# Patient Record
Sex: Female | Born: 1940 | Race: White | Hispanic: No | Marital: Married | Smoking: Former smoker
Health system: Southern US, Community
[De-identification: ages and names within clinical notes are randomized; demographics above are authoritative.]

## PROBLEM LIST (undated history)

## (undated) DIAGNOSIS — E7800 Pure hypercholesterolemia, unspecified: Secondary | ICD-10-CM

## (undated) DIAGNOSIS — Z87891 Personal history of nicotine dependence: Secondary | ICD-10-CM

## (undated) DIAGNOSIS — G5602 Carpal tunnel syndrome, left upper limb: Secondary | ICD-10-CM

## (undated) DIAGNOSIS — R112 Nausea with vomiting, unspecified: Secondary | ICD-10-CM

## (undated) DIAGNOSIS — E119 Type 2 diabetes mellitus without complications: Secondary | ICD-10-CM

## (undated) DIAGNOSIS — M549 Dorsalgia, unspecified: Secondary | ICD-10-CM

## (undated) DIAGNOSIS — K760 Fatty (change of) liver, not elsewhere classified: Secondary | ICD-10-CM

## (undated) DIAGNOSIS — D696 Thrombocytopenia, unspecified: Secondary | ICD-10-CM

## (undated) DIAGNOSIS — K219 Gastro-esophageal reflux disease without esophagitis: Secondary | ICD-10-CM

## (undated) DIAGNOSIS — F32A Depression, unspecified: Secondary | ICD-10-CM

## (undated) DIAGNOSIS — I10 Essential (primary) hypertension: Secondary | ICD-10-CM

## (undated) DIAGNOSIS — D509 Iron deficiency anemia, unspecified: Secondary | ICD-10-CM

## (undated) DIAGNOSIS — F329 Major depressive disorder, single episode, unspecified: Secondary | ICD-10-CM

## (undated) DIAGNOSIS — E559 Vitamin D deficiency, unspecified: Secondary | ICD-10-CM

## (undated) DIAGNOSIS — G8929 Other chronic pain: Secondary | ICD-10-CM

## (undated) DIAGNOSIS — Z9889 Other specified postprocedural states: Secondary | ICD-10-CM

## (undated) HISTORY — PX: COLONOSCOPY: SHX174

## (undated) HISTORY — PX: CATARACT EXTRACTION, BILATERAL: SHX1313

## (undated) HISTORY — PX: ESOPHAGOGASTRODUODENOSCOPY: SHX1529

## (undated) HISTORY — PX: APPENDECTOMY: SHX54

## (undated) HISTORY — PX: EYE SURGERY: SHX253

---

## 1994-06-20 HISTORY — PX: VAGINAL HYSTERECTOMY: SHX2639

## 2005-04-09 ENCOUNTER — Ambulatory Visit: Payer: Self-pay

## 2006-05-06 ENCOUNTER — Ambulatory Visit: Payer: Self-pay

## 2007-02-27 ENCOUNTER — Ambulatory Visit: Payer: Self-pay | Admitting: Unknown Physician Specialty

## 2007-05-12 ENCOUNTER — Ambulatory Visit: Payer: Self-pay

## 2008-05-13 ENCOUNTER — Ambulatory Visit: Payer: Self-pay

## 2009-11-16 ENCOUNTER — Ambulatory Visit: Payer: Self-pay | Admitting: Ophthalmology

## 2010-02-27 ENCOUNTER — Ambulatory Visit: Payer: Self-pay | Admitting: Unknown Physician Specialty

## 2010-03-28 ENCOUNTER — Ambulatory Visit: Payer: Self-pay

## 2010-04-19 ENCOUNTER — Ambulatory Visit: Payer: Self-pay

## 2010-04-24 ENCOUNTER — Ambulatory Visit: Payer: Self-pay

## 2010-05-25 ENCOUNTER — Ambulatory Visit: Payer: Self-pay

## 2010-06-29 ENCOUNTER — Ambulatory Visit: Payer: Self-pay | Admitting: Unknown Physician Specialty

## 2010-06-30 LAB — PATHOLOGY REPORT

## 2010-10-25 ENCOUNTER — Ambulatory Visit: Payer: Self-pay | Admitting: Ophthalmology

## 2011-05-01 ENCOUNTER — Ambulatory Visit: Payer: Self-pay

## 2012-08-12 ENCOUNTER — Ambulatory Visit: Payer: Self-pay | Admitting: Internal Medicine

## 2012-08-18 ENCOUNTER — Emergency Department: Payer: Self-pay | Admitting: Unknown Physician Specialty

## 2013-08-13 ENCOUNTER — Ambulatory Visit: Payer: Self-pay | Admitting: Internal Medicine

## 2014-04-14 DIAGNOSIS — E559 Vitamin D deficiency, unspecified: Secondary | ICD-10-CM | POA: Insufficient documentation

## 2014-04-14 DIAGNOSIS — G8929 Other chronic pain: Secondary | ICD-10-CM | POA: Insufficient documentation

## 2014-04-14 DIAGNOSIS — E7800 Pure hypercholesterolemia, unspecified: Secondary | ICD-10-CM | POA: Insufficient documentation

## 2014-04-14 DIAGNOSIS — K219 Gastro-esophageal reflux disease without esophagitis: Secondary | ICD-10-CM | POA: Insufficient documentation

## 2014-04-14 DIAGNOSIS — E119 Type 2 diabetes mellitus without complications: Secondary | ICD-10-CM | POA: Insufficient documentation

## 2014-04-14 DIAGNOSIS — I10 Essential (primary) hypertension: Secondary | ICD-10-CM | POA: Insufficient documentation

## 2014-07-02 ENCOUNTER — Ambulatory Visit: Payer: Self-pay | Admitting: Family Medicine

## 2014-08-16 ENCOUNTER — Ambulatory Visit: Payer: Self-pay | Admitting: Internal Medicine

## 2015-04-19 ENCOUNTER — Other Ambulatory Visit: Payer: Self-pay | Admitting: Internal Medicine

## 2015-04-19 DIAGNOSIS — Z1231 Encounter for screening mammogram for malignant neoplasm of breast: Secondary | ICD-10-CM

## 2015-07-22 ENCOUNTER — Encounter: Payer: Self-pay | Admitting: *Deleted

## 2015-07-25 ENCOUNTER — Ambulatory Visit: Payer: Medicare Other | Admitting: *Deleted

## 2015-07-25 ENCOUNTER — Encounter: Payer: Self-pay | Admitting: Anesthesiology

## 2015-07-25 ENCOUNTER — Ambulatory Visit
Admission: RE | Admit: 2015-07-25 | Discharge: 2015-07-25 | Disposition: A | Discharge status: Home / Self Care | Payer: Medicare Other | Source: Ambulatory Visit | Attending: Unknown Physician Specialty | Admitting: Unknown Physician Specialty

## 2015-07-25 ENCOUNTER — Encounter
Admission: RE | Disposition: A | Discharge status: Home / Self Care | Payer: Self-pay | Source: Ambulatory Visit | Attending: Unknown Physician Specialty

## 2015-07-25 DIAGNOSIS — K3189 Other diseases of stomach and duodenum: Secondary | ICD-10-CM | POA: Diagnosis not present

## 2015-07-25 DIAGNOSIS — D509 Iron deficiency anemia, unspecified: Secondary | ICD-10-CM | POA: Insufficient documentation

## 2015-07-25 DIAGNOSIS — I10 Essential (primary) hypertension: Secondary | ICD-10-CM | POA: Diagnosis not present

## 2015-07-25 DIAGNOSIS — Z888 Allergy status to other drugs, medicaments and biological substances status: Secondary | ICD-10-CM | POA: Diagnosis not present

## 2015-07-25 DIAGNOSIS — Z7984 Long term (current) use of oral hypoglycemic drugs: Secondary | ICD-10-CM | POA: Insufficient documentation

## 2015-07-25 DIAGNOSIS — K210 Gastro-esophageal reflux disease with esophagitis: Secondary | ICD-10-CM | POA: Insufficient documentation

## 2015-07-25 DIAGNOSIS — E119 Type 2 diabetes mellitus without complications: Secondary | ICD-10-CM | POA: Insufficient documentation

## 2015-07-25 DIAGNOSIS — Z885 Allergy status to narcotic agent status: Secondary | ICD-10-CM | POA: Insufficient documentation

## 2015-07-25 DIAGNOSIS — Z86010 Personal history of colonic polyps: Secondary | ICD-10-CM | POA: Diagnosis not present

## 2015-07-25 DIAGNOSIS — E669 Obesity, unspecified: Secondary | ICD-10-CM | POA: Diagnosis not present

## 2015-07-25 DIAGNOSIS — E7800 Pure hypercholesterolemia, unspecified: Secondary | ICD-10-CM | POA: Diagnosis not present

## 2015-07-25 DIAGNOSIS — Z6828 Body mass index (BMI) 28.0-28.9, adult: Secondary | ICD-10-CM | POA: Insufficient documentation

## 2015-07-25 DIAGNOSIS — F329 Major depressive disorder, single episode, unspecified: Secondary | ICD-10-CM | POA: Diagnosis not present

## 2015-07-25 DIAGNOSIS — K640 First degree hemorrhoids: Secondary | ICD-10-CM | POA: Insufficient documentation

## 2015-07-25 DIAGNOSIS — K2950 Unspecified chronic gastritis without bleeding: Secondary | ICD-10-CM | POA: Diagnosis not present

## 2015-07-25 HISTORY — DX: Major depressive disorder, single episode, unspecified: F32.9

## 2015-07-25 HISTORY — DX: Dorsalgia, unspecified: M54.9

## 2015-07-25 HISTORY — DX: Essential (primary) hypertension: I10

## 2015-07-25 HISTORY — DX: Depression, unspecified: F32.A

## 2015-07-25 HISTORY — DX: Type 2 diabetes mellitus without complications: E11.9

## 2015-07-25 HISTORY — DX: Pure hypercholesterolemia, unspecified: E78.00

## 2015-07-25 HISTORY — PX: COLONOSCOPY WITH PROPOFOL: SHX5780

## 2015-07-25 HISTORY — DX: Gastro-esophageal reflux disease without esophagitis: K21.9

## 2015-07-25 HISTORY — PX: ESOPHAGOGASTRODUODENOSCOPY (EGD) WITH PROPOFOL: SHX5813

## 2015-07-25 LAB — GLUCOSE, CAPILLARY: Glucose-Capillary: 143 mg/dL — ABNORMAL HIGH (ref 65–99)

## 2015-07-25 SURGERY — COLONOSCOPY WITH PROPOFOL
Anesthesia: General

## 2015-07-25 MED ORDER — FENTANYL CITRATE (PF) 100 MCG/2ML IJ SOLN
INTRAMUSCULAR | Status: DC | PRN
  Administered 2015-07-25: 50 ug via INTRAVENOUS

## 2015-07-25 MED ORDER — METOCLOPRAMIDE HCL 5 MG/ML IJ SOLN
INTRAMUSCULAR | Status: DC | PRN
  Administered 2015-07-25: 10 mg via INTRAVENOUS

## 2015-07-25 MED ORDER — ONDANSETRON HCL 4 MG/2ML IJ SOLN
INTRAMUSCULAR | Status: DC | PRN
  Administered 2015-07-25: 4 mg via INTRAVENOUS

## 2015-07-25 MED ORDER — SODIUM CHLORIDE 0.9 % IV SOLN: INTRAVENOUS | Status: DC

## 2015-07-25 MED ORDER — MIDAZOLAM HCL 2 MG/2ML IJ SOLN
INTRAMUSCULAR | Status: DC | PRN
  Administered 2015-07-25: 1 mg via INTRAVENOUS

## 2015-07-25 MED ORDER — PROPOFOL 500 MG/50ML IV EMUL
INTRAVENOUS | Status: DC | PRN
  Administered 2015-07-25: 120 ug/kg/min via INTRAVENOUS

## 2015-07-25 MED ORDER — SODIUM CHLORIDE 0.9 % IV SOLN
INTRAVENOUS | Status: DC
  Administered 2015-07-25: 1000 mL via INTRAVENOUS

## 2015-07-25 MED ORDER — LIDOCAINE HCL (CARDIAC) 20 MG/ML IV SOLN
INTRAVENOUS | Status: DC | PRN
  Administered 2015-07-25: 50 mg via INTRAVENOUS

## 2015-07-25 NOTE — H&P (Signed)
   Primary Care Physician:  Rafael BihariWALKER III, JOHN B, MD Primary Gastroenterologist:  Dr. Mechele CollinElliott  Pre-Procedure History & Physical: HPI:  Monica OlpLinda V Hess is a 74 y.o. female is here for an endoscopy and colonoscopy.   Past Medical History  Diagnosis Date  . Depression   . Hypercholesterolemia   . Back pain   . GERD (gastroesophageal reflux disease)   . Hypertension   . Diabetes mellitus without complication [facility](HCC)     Past Surgical History  Procedure Laterality Date  . Appendectomy    . Eye surgery    . Colonoscopy    . Esophagogastroduodenoscopy      Prior to Admission medications   Medication Sig Start Date End Date Taking? Authorizing Provider  amLODipine-olmesartan (AZOR) 5-40 MG tablet Take 1 tablet by mouth daily.   Yes Historical Provider, MD  cholestyramine Lanetta Inch(QUESTRAN) 4 G packet Take 4 g by mouth 3 (three) times daily with meals.   Yes Historical Provider, MD  estrogens, conjugated, (PREMARIN) 0.3 MG tablet Take 0.3 mg by mouth daily. Take daily for 21 days then do not take for 7 days.   Yes Historical Provider, MD  fluticasone (FLONASE) 50 MCG/ACT nasal spray Place into both nostrils daily.   Yes Historical Provider, MD  glipiZIDE (GLUCOTROL) 5 MG tablet Take by mouth daily before breakfast.   Yes Historical Provider, MD  metFORMIN (GLUCOPHAGE) 500 MG tablet Take by mouth 2 (two) times daily with a meal.   Yes Historical Provider, MD  omeprazole (PRILOSEC) 20 MG capsule Take 20 mg by mouth daily.   Yes Historical Provider, MD    Allergies as of 06/21/2015  . (Not on File)    No family history on file.  Social History   Social History  . Marital Status: Married    Spouse Name: N/A  . Number of Children: N/A  . Years of Education: N/A   Occupational History  . Not on file.   Social History Main Topics  . Smoking status: Not on file  . Smokeless tobacco: Not on file  . Alcohol Use: Not on file  . Drug Use: Not on file  . Sexual Activity: Not on file   Other  Topics Concern  . Not on file   Social History Narrative  . No narrative on file    Review of Systems: See HPI, otherwise negative ROS  Physical Exam: BP 186/58 mmHg  Pulse 68  Temp(Src) 97.8 F (36.6 C) (Tympanic)  Resp 16  Ht 5\' 5"  (1.651 m)  Wt 78.019 kg (172 lb)  BMI 28.62 kg/m2  SpO2 99% General:   Alert,  pleasant and cooperative in NAD Head:  Normocephalic and atraumatic. Neck:  Supple; no masses or thyromegaly. Lungs:  Clear throughout to auscultation.    Heart:  Regular rate and rhythm. Abdomen:  Soft, nontender and nondistended. Normal bowel sounds, without guarding, and without rebound.   Neurologic:  Alert and  oriented x4;  grossly normal neurologically.  Impression/Plan: Monica Hess is here for an endoscopy and colonoscopy to be performed for iron def anemia  Risks, benefits, limitations, and alternatives regarding  endoscopy and colonoscopy have been reviewed with the patient.  Questions have been answered.  All parties agreeable.   [name], Terryl Molinelli, MD  07/25/2015, 9:01 AM

## 2015-07-25 NOTE — Op Note (Signed)
[facility] Gastroenterology Patient Name: Monica Hess Procedure Date: 07/25/2015 8:54 AM MRN: [id number] Account #: [id number] Date of Birth: 1941/01/23 Admit Type: Outpatient Age: 74 Room: [facility] ENDO ROOM 1 Gender: Female Note Status: Finalized Procedure:         Colonoscopy Indications:       Iron deficiency anemia, Unexplained iron deficiency                     anemia, Personal history of colon polyps. Providers:         Scot Jun, MD Referring MD:      Letta Pate. Danne Harbor, MD (Referring MD) Medicines:         Propofol per Anesthesia Complications:     No immediate complications. Procedure:         Pre-Anesthesia Assessment:                    - After reviewing the risks and benefits, the patient was                     deemed in satisfactory condition to undergo the procedure.                    After obtaining informed consent, the colonoscope was                     passed under direct vision. Throughout the procedure, the                     patient's blood pressure, pulse, and oxygen saturations                     were monitored continuously. The Colonoscope was                     introduced through the anus and advanced to the the cecum,                     identified by appendiceal orifice and ileocecal valve. The                     colonoscopy was performed without difficulty. The patient                     tolerated the procedure well. The quality of the bowel                     preparation was excellent. Findings:      Internal hemorrhoids were found during endoscopy. The hemorrhoids were       small and Grade I (internal hemorrhoids that do not prolapse).      The exam was otherwise without abnormality. Prep excellent. Impression:        - Internal hemorrhoids.                    - The examination was otherwise normal.                    - No specimens collected. Recommendation:    - Repeat colonoscopy in 5 years for  surveillance. Scot Jun, MD 07/25/2015 9:45:31 AM This report has been signed electronically. Number of Addenda: 0 Note Initiated On: 07/25/2015 8:54 AM Scope Withdrawal Time: 0 hours 9 minutes 24 seconds  Total Procedure Duration: 0 hours  19 minutes 17 seconds       [facility]

## 2015-07-25 NOTE — Transfer of Care (Signed)
Immediate Anesthesia Transfer of Care Note  Patient: Monica OlpLinda V Hess  Procedure(s) Performed: Procedure(s): COLONOSCOPY WITH PROPOFOL (N/A) ESOPHAGOGASTRODUODENOSCOPY (EGD) WITH PROPOFOL (N/A)  Patient Location: PACU  Anesthesia Type:General  Level of Consciousness: awake, alert  and sedated  Airway & Oxygen Therapy: Patient Spontanous Breathing and Patient connected to nasal cannula oxygen  Post-op Assessment: Report given to RN and Post -op Vital signs reviewed and stable  Post vital signs: Reviewed and stable  Last Vitals:  Filed Vitals:   07/25/15 0744  BP: 186/58  Pulse: 68  Temp: 36.6 C  Resp: 16    Complications: No apparent anesthesia complications

## 2015-07-25 NOTE — Anesthesia Procedure Notes (Signed)
Performed by: Tonia GhentOOK-MARTIN, Marvelyn Bouchillon Pre-anesthesia Checklist: Patient identified, Emergency Drugs available, Suction available and Patient being monitored Patient Re-evaluated:Patient Re-evaluated prior to inductionOxygen Delivery Method: Nasal cannula Preoxygenation: Pre-oxygenation with 100% oxygen Intubation Type: IV induction Airway Equipment and Method: Bite block

## 2015-07-25 NOTE — Anesthesia Postprocedure Evaluation (Signed)
  Anesthesia Post-op Note  Patient: Monica Hess  Procedure(s) Performed: Procedure(s): COLONOSCOPY WITH PROPOFOL (N/A) ESOPHAGOGASTRODUODENOSCOPY (EGD) WITH PROPOFOL (N/A)  Anesthesia type:General  Patient location: PACU  Post pain: Pain level controlled  Post assessment: Post-op Vital signs reviewed, Patient's Cardiovascular Status Stable, Respiratory Function Stable, Patent Airway and No signs of Nausea or vomiting  Post vital signs: Reviewed and stable  Last Vitals:  Filed Vitals:   07/25/15 0949  BP: 150/56  Pulse: 66  Temp: 35.8 C  Resp: 17    Level of consciousness: awake, alert  and patient cooperative  Complications: No apparent anesthesia complications

## 2015-07-25 NOTE — Op Note (Signed)
[facility] Gastroenter[facility] Patient Name: Monica KocherLinda Hess Procedure Date: 07/25/2015 9:04 AM MRN: [id number] Account #: [id number] Date of Birth: [birth year] Admit Type: Outpatient Age: [age] Room[facility] ENDO ROOM 1 Gender: Female Note Status: Finalized Procedure:         Upper GI endoscopy Indications:       Unexplained iron deficiency anemia Providers:         Scot Junobert T. Husain Costabile, MD Referring MD:      Letta PateJohn B. Danne HarborWalker III, MD (Referring MD) Medicines:         Propofol per Anesthesia Complications:     No immediate complications. Procedure:         Pre-Anesthesia Assessment:                    - After reviewing the risks and benefits, the patient was                     deemed in satisfactory condition to undergo the procedure.                    After obtaining informed consent, the endoscope was passed                     under direct vision. Throughout the procedure, the                     patient's blood pressure, pulse, and oxygen saturations                     were monitored continuously. The Endoscope was introduced                     through the mouth, and advanced to the second part of                     duodenum. The upper GI endoscopy was accomplished without                     difficulty. The patient tolerated the procedure well. Findings:      There were esophageal mucosal changes suspicious for short-segment       Barrett's esophagus present at the gastroesophageal junction. The       maximum longitudinal extent of these mucosal changes was 1-2 cm in       length. Biopsies were taken with a cold forceps for histology. GEJ       40-41cm.      The entire examined stomach was normal. Biopsies were taken with a cold       forceps for histology. Biopsies were taken with a cold forceps for       Helicobacter pylori testing.      The duodenal bulb, first part of the duodenum and 2nd part of the       duodenum were normal. Biopsies for histology were taken with  a cold       forceps for for evaluation of possible celiac disease. 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 Biopsied.                    - Normal stomach. Biopsied.                    -  Normal duodenal bulb, first part of the duodenum and 2nd                     part of the duodenum. Biopsied. Recommendation:    - Await pathology results. Scot Jun, MD 07/25/2015 9:21:42 AM This report has been signed electronically. Number of Addenda: 0 Note Initiated On: 07/25/2015 9:04 AM      [facility]

## 2015-07-25 NOTE — Anesthesia Preprocedure Evaluation (Signed)
Anesthesia Evaluation  Patient identified by MRN, date of birth, ID band Patient awake    Reviewed: Allergy & Precautions, NPO status , Patient's Chart, lab work & pertinent test results  History of Anesthesia Complications (+) PONV  Airway Mallampati: II  TM Distance: >3 FB Neck ROM: Limited    Dental  (+) Teeth Intact   Pulmonary    Pulmonary exam normal        Cardiovascular Exercise Tolerance: Good hypertension, Pt. on medications and Pt. on home beta blockers Normal cardiovascular exam     Neuro/Psych    GI/Hepatic GERD  Medicated and Controlled,  Endo/Other  diabetes, Type 2BG 143.  Renal/GU      Musculoskeletal   Abdominal (+) + obese,  Abdomen: soft.    Peds  Hematology   Anesthesia Other Findings   Reproductive/Obstetrics                             Anesthesia Physical Anesthesia Plan  ASA: III  Anesthesia Plan: General   Post-op Pain Management:    Induction: Intravenous  Airway Management Planned: Nasal Cannula  Additional Equipment:   Intra-op Plan:   Post-operative Plan:   Informed Consent: I have reviewed the patients History and Physical, chart, labs and discussed the procedure including the risks, benefits and alternatives for the proposed anesthesia with the patient or authorized representative who has indicated his/her understanding and acceptance.     Plan Discussed with: CRNA  Anesthesia Plan Comments:         Anesthesia Quick Evaluation

## 2015-07-26 ENCOUNTER — Encounter: Payer: Self-pay | Admitting: Unknown Physician Specialty

## 2015-07-28 LAB — SURGICAL PATHOLOGY

## 2015-08-22 ENCOUNTER — Ambulatory Visit
Admission: RE | Admit: 2015-08-22 | Discharge: 2015-08-22 | Disposition: A | Discharge status: Home / Self Care | Payer: Medicare Other | Source: Ambulatory Visit | Attending: Internal Medicine | Admitting: Internal Medicine

## 2015-08-22 ENCOUNTER — Other Ambulatory Visit: Payer: Self-pay | Admitting: Internal Medicine

## 2015-08-22 DIAGNOSIS — Z1231 Encounter for screening mammogram for malignant neoplasm of breast: Secondary | ICD-10-CM | POA: Diagnosis not present

## 2015-08-23 DIAGNOSIS — D696 Thrombocytopenia, unspecified: Secondary | ICD-10-CM | POA: Insufficient documentation

## 2015-11-17 ENCOUNTER — Ambulatory Visit: Payer: Self-pay | Admitting: Urology

## 2016-05-24 ENCOUNTER — Other Ambulatory Visit: Payer: Self-pay | Admitting: Internal Medicine

## 2016-05-24 DIAGNOSIS — Z1231 Encounter for screening mammogram for malignant neoplasm of breast: Secondary | ICD-10-CM

## 2016-08-22 ENCOUNTER — Ambulatory Visit
Admission: RE | Admit: 2016-08-22 | Discharge: 2016-08-22 | Disposition: A | Discharge status: Home / Self Care | Payer: Medicare Other | Source: Ambulatory Visit | Attending: Internal Medicine | Admitting: Internal Medicine

## 2016-08-22 DIAGNOSIS — Z1231 Encounter for screening mammogram for malignant neoplasm of breast: Secondary | ICD-10-CM | POA: Insufficient documentation

## 2017-05-29 ENCOUNTER — Other Ambulatory Visit: Payer: Self-pay | Admitting: Internal Medicine

## 2017-05-29 DIAGNOSIS — Z1231 Encounter for screening mammogram for malignant neoplasm of breast: Secondary | ICD-10-CM

## 2017-09-03 ENCOUNTER — Ambulatory Visit
Admission: RE | Admit: 2017-09-03 | Discharge: 2017-09-03 | Disposition: A | Discharge status: Home / Self Care | Payer: Medicare Other | Source: Ambulatory Visit | Attending: Internal Medicine | Admitting: Internal Medicine

## 2017-09-03 DIAGNOSIS — Z1231 Encounter for screening mammogram for malignant neoplasm of breast: Secondary | ICD-10-CM

## 2017-09-28 ENCOUNTER — Encounter: Payer: Self-pay | Admitting: Emergency Medicine

## 2017-09-28 ENCOUNTER — Emergency Department: Payer: Medicare Other

## 2017-09-28 ENCOUNTER — Emergency Department
Admission: EM | Admit: 2017-09-28 | Discharge: 2017-09-28 | Disposition: A | Discharge status: Home / Self Care | Payer: Medicare Other | Attending: Emergency Medicine | Admitting: Emergency Medicine

## 2017-09-28 DIAGNOSIS — Y999 Unspecified external cause status: Secondary | ICD-10-CM | POA: Insufficient documentation

## 2017-09-28 DIAGNOSIS — Y92524 Gas station as the place of occurrence of the external cause: Secondary | ICD-10-CM | POA: Insufficient documentation

## 2017-09-28 DIAGNOSIS — Z79899 Other long term (current) drug therapy: Secondary | ICD-10-CM | POA: Insufficient documentation

## 2017-09-28 DIAGNOSIS — E119 Type 2 diabetes mellitus without complications: Secondary | ICD-10-CM | POA: Diagnosis not present

## 2017-09-28 DIAGNOSIS — Z7984 Long term (current) use of oral hypoglycemic drugs: Secondary | ICD-10-CM | POA: Insufficient documentation

## 2017-09-28 DIAGNOSIS — I10 Essential (primary) hypertension: Secondary | ICD-10-CM | POA: Diagnosis not present

## 2017-09-28 DIAGNOSIS — W010XXA Fall on same level from slipping, tripping and stumbling without subsequent striking against object, initial encounter: Secondary | ICD-10-CM | POA: Diagnosis not present

## 2017-09-28 DIAGNOSIS — S0990XA Unspecified injury of head, initial encounter: Secondary | ICD-10-CM | POA: Diagnosis present

## 2017-09-28 DIAGNOSIS — W19XXXA Unspecified fall, initial encounter: Secondary | ICD-10-CM

## 2017-09-28 DIAGNOSIS — S01511A Laceration without foreign body of lip, initial encounter: Secondary | ICD-10-CM | POA: Insufficient documentation

## 2017-09-28 DIAGNOSIS — Y9301 Activity, walking, marching and hiking: Secondary | ICD-10-CM | POA: Insufficient documentation

## 2017-09-28 DIAGNOSIS — S0181XA Laceration without foreign body of other part of head, initial encounter: Secondary | ICD-10-CM

## 2017-09-28 MED ORDER — LIDOCAINE-EPINEPHRINE-TETRACAINE (LET) SOLUTION: 3.0000 mL | Freq: Once | NASAL | Status: DC

## 2017-09-28 MED ORDER — LIDOCAINE-EPINEPHRINE-TETRACAINE (LET) SOLUTION
NASAL | Status: AC
Start: 2017-09-28 — End: 2017-09-28
  Administered 2017-09-28: 3 mL
  Filled 2017-09-28: qty 3

## 2017-09-28 MED ORDER — CEPHALEXIN 500 MG PO CAPS: 500.0000 mg | ORAL_CAPSULE | Freq: Three times a day (TID) | ORAL | 0 refills | Status: AC

## 2017-09-28 MED ORDER — LIDOCAINE HCL (PF) 1 % IJ SOLN
INTRAMUSCULAR | Status: AC
  Filled 2017-09-28: qty 5

## 2017-09-28 NOTE — ED Provider Notes (Signed)
[facility] Emergency Department Provider Note  ____________________________________________  Time seen: Approximately 6:14 PM  I have reviewed the triage vital signs and the nursing notes.   HISTORY  Chief Complaint Fall    HPI Monica Hess is a 77 y.o. female presents to the emergency department patient with a 1 cm upper lip anterior laceration after patient tripped and fell at a gas station hours ago.  Patient hit her head but did not lose consciousness.  She denies new blurry vision, nausea, vomiting or disorientation.  She has been ambulating without difficulty.  No neck pain or radiculopathy in the upper or lower extremities.  Tetanus status is up-to-date.  Past Medical History:  Diagnosis Date  . Back pain   . Depression   . Diabetes mellitus without complication (HCC)   . GERD (gastroesophageal reflux disease)   . Hypercholesterolemia   . Hypertension     There are no active problems to display for this patient.   Past Surgical History:  Procedure Laterality Date  . APPENDECTOMY    . COLONOSCOPY    . COLONOSCOPY WITH PROPOFOL N/A 07/25/2015   Procedure: COLONOSCOPY WITH PROPOFOL;  Surgeon: Scot Jun, MD;  Location: [facility] ENDOSCOPY;  Service: Endoscopy;  Laterality: N/A;  . ESOPHAGOGASTRODUODENOSCOPY    . ESOPHAGOGASTRODUODENOSCOPY (EGD) WITH PROPOFOL N/A 07/25/2015   Procedure: ESOPHAGOGASTRODUODENOSCOPY (EGD) WITH PROPOFOL;  Surgeon: Scot Jun, MD;  Location: [facility] ENDOSCOPY;  Service: Endoscopy;  Laterality: N/A;  . EYE SURGERY      Prior to Admission medications   Medication Sig Start Date End Date Taking? Authorizing Provider  amLODipine-olmesartan (AZOR) 5-40 MG tablet Take 1 tablet by mouth daily.    [provider]  cephALEXin (KEFLEX) 500 MG capsule Take 1 capsule (500 mg total) by mouth 3 (three) times daily for 10 days. 09/28/17 10/08/17  Orvil Feil, PA-C  cholestyramine Lanetta Inch) 4 G packet Take 4 g by  mouth 3 (three) times daily with meals.    [provider]  estrogens, conjugated, (PREMARIN) 0.3 MG tablet Take 0.3 mg by mouth daily. Take daily for 21 days then do not take for 7 days.    [provider]  fluticasone (FLONASE) 50 MCG/ACT nasal spray Place into both nostrils daily.    [provider]  glipiZIDE (GLUCOTROL) 5 MG tablet Take by mouth daily before breakfast.    [provider]  metFORMIN (GLUCOPHAGE) 500 MG tablet Take by mouth 2 (two) times daily with a meal.    [provider]  omeprazole (PRILOSEC) 20 MG capsule Take 20 mg by mouth daily.    [provider]    Allergies Codeine sulfate; Lipitor [atorvastatin]; Nitrofurantoin; and Vytorin [ezetimibe-simvastatin]  No family history on file.  Social History Social History   Tobacco Use  . Smoking status: Not on file  Substance Use Topics  . Alcohol use: Not on file  . Drug use: Not on file     Review of Systems  Constitutional: No fever/chills Eyes: No visual changes. No discharge ENT: No upper respiratory complaints. Cardiovascular: no chest pain. Respiratory: no cough. No SOB. Gastrointestinal: No abdominal pain.  No nausea, no vomiting.  No diarrhea.  No constipation. Musculoskeletal: Negative for musculoskeletal pain. Skin: Patient has upper interior lip laceration.  Neurological: Negative for headaches, focal weakness or numbness.   ____________________________________________   PHYSICAL EXAM:  VITAL SIGNS: ED Triage Vitals  Enc Vitals Group     BP 09/28/17 1428 111/73  Pulse Rate 09/28/17 1428 66     Resp 09/28/17 1428 16     Temp 09/28/17 1428 97.7 F (36.5 C)     Temp Source 09/28/17 1428 Oral     SpO2 09/28/17 1428 97 %     Weight 09/28/17 1428 174 lb (78.9 kg)     Height 09/28/17 1428 5\' 6"  (1.676 m)     Head Circumference --      Peak Flow --      Pain Score 09/28/17 1436 5     Pain Loc --      Pain Edu? --      Excl. in GC?  --      Constitutional: Alert and oriented. Well appearing and in no acute distress. Eyes: Conjunctivae are normal. PERRL. EOMI. Head: Atraumatic. ENT:      Ears: TMs are pearly bilaterally without evidence of bloody effusion.      Nose: No congestion/rhinnorhea.      Mouth/Throat: Mucous membranes are moist.  Neck: No stridor.  No cervical spine tenderness to palpation. Cardiovascular: Normal rate, regular rhythm. Normal S1 and S2.  Good peripheral circulation. Respiratory: Normal respiratory effort without tachypnea or retractions. Lungs CTAB. Good air entry to the bases with no decreased or absent breath sounds. Gastrointestinal: Bowel sounds 4 quadrants. Soft and nontender to palpation. No guarding or rigidity. No palpable masses. No distention. No CVA tenderness. Musculoskeletal: Full range of motion to all extremities. No gross deformities appreciated. Neurologic:  Normal speech and language. No gross focal neurologic deficits are appreciated.  Skin:  Patient has 1cm upper lip interior laceration. Facial bruising and abrasions visualized.  Psychiatric: Mood and affect are normal. Speech and behavior are normal. Patient exhibits appropriate insight and judgement.   ____________________________________________   LABS (all labs ordered are listed, but only abnormal results are displayed)  Labs Reviewed - No data to display ____________________________________________  EKG   ____________________________________________  RADIOLOGY Geraldo Pitter, personally viewed and evaluated these images (plain radiographs) as part of my medical decision making, as well as reviewing the written report by the radiologist.  Ct Head Wo Contrast  Result Date: 09/28/2017 CLINICAL DATA:  Fall, facial pain EXAM: CT HEAD WITHOUT CONTRAST CT MAXILLOFACIAL WITHOUT CONTRAST CT CERVICAL SPINE WITHOUT CONTRAST TECHNIQUE: Multidetector CT imaging of the head, cervical spine, and maxillofacial  structures were performed using the standard protocol without intravenous contrast. Multiplanar CT image reconstructions of the cervical spine and maxillofacial structures were also generated. COMPARISON:  None. FINDINGS: CT HEAD FINDINGS Brain: No evidence of acute infarction, hemorrhage, hydrocephalus, extra-axial collection or mass lesion/mass effect. Subcortical white matter and periventricular small vessel ischemic changes. Vascular: Mild intracranial atherosclerosis. Skull: Normal. Negative for fracture or focal lesion. Other: None. CT MAXILLOFACIAL FINDINGS Osseous: No evidence of maxillofacial fracture. The mandible is intact. The bilateral mandibular condyles are well-seated in the TMJs. Orbits: The bilateral orbits, including the globes and retroconal soft tissues, are within normal limits. Sinuses: The visualized paranasal sinuses are essentially clear. The mastoid air cells are unopacified. Soft tissues: Negative. CT CERVICAL SPINE FINDINGS Alignment: Normal cervical lordosis. Skull base and vertebrae: No acute fracture. No primary bone lesion or focal pathologic process. Soft tissues and spinal canal: No prevertebral fluid or swelling. No visible canal hematoma. Disc levels: Vertebra body heights and intervertebral disc spaces are maintained. Spinal canal is patent. Upper chest: Visualized lung apices are clear. Other: Visualized thyroid is unremarkable. IMPRESSION: No evidence of acute intracranial abnormality. Small vessel ischemic changes. No  evidence of maxillofacial fracture. Negative cervical spine CT. Electronically Signed   By: Charline Bills M.D.   On: 09/28/2017 16:07   Ct Cervical Spine Wo Contrast  Result Date: 09/28/2017 CLINICAL DATA:  Fall, facial pain EXAM: CT HEAD WITHOUT CONTRAST CT MAXILLOFACIAL WITHOUT CONTRAST CT CERVICAL SPINE WITHOUT CONTRAST TECHNIQUE: Multidetector CT imaging of the head, cervical spine, and maxillofacial structures were performed using the standard  protocol without intravenous contrast. Multiplanar CT image reconstructions of the cervical spine and maxillofacial structures were also generated. COMPARISON:  None. FINDINGS: CT HEAD FINDINGS Brain: No evidence of acute infarction, hemorrhage, hydrocephalus, extra-axial collection or mass lesion/mass effect. Subcortical white matter and periventricular small vessel ischemic changes. Vascular: Mild intracranial atherosclerosis. Skull: Normal. Negative for fracture or focal lesion. Other: None. CT MAXILLOFACIAL FINDINGS Osseous: No evidence of maxillofacial fracture. The mandible is intact. The bilateral mandibular condyles are well-seated in the TMJs. Orbits: The bilateral orbits, including the globes and retroconal soft tissues, are within normal limits. Sinuses: The visualized paranasal sinuses are essentially clear. The mastoid air cells are unopacified. Soft tissues: Negative. CT CERVICAL SPINE FINDINGS Alignment: Normal cervical lordosis. Skull base and vertebrae: No acute fracture. No primary bone lesion or focal pathologic process. Soft tissues and spinal canal: No prevertebral fluid or swelling. No visible canal hematoma. Disc levels: Vertebra body heights and intervertebral disc spaces are maintained. Spinal canal is patent. Upper chest: Visualized lung apices are clear. Other: Visualized thyroid is unremarkable. IMPRESSION: No evidence of acute intracranial abnormality. Small vessel ischemic changes. No evidence of maxillofacial fracture. Negative cervical spine CT. Electronically Signed   By: Charline Bills M.D.   On: 09/28/2017 16:07   Ct Maxillofacial Wo Contrast  Result Date: 09/28/2017 CLINICAL DATA:  Fall, facial pain EXAM: CT HEAD WITHOUT CONTRAST CT MAXILLOFACIAL WITHOUT CONTRAST CT CERVICAL SPINE WITHOUT CONTRAST TECHNIQUE: Multidetector CT imaging of the head, cervical spine, and maxillofacial structures were performed using the standard protocol without intravenous contrast. Multiplanar  CT image reconstructions of the cervical spine and maxillofacial structures were also generated. COMPARISON:  None. FINDINGS: CT HEAD FINDINGS Brain: No evidence of acute infarction, hemorrhage, hydrocephalus, extra-axial collection or mass lesion/mass effect. Subcortical white matter and periventricular small vessel ischemic changes. Vascular: Mild intracranial atherosclerosis. Skull: Normal. Negative for fracture or focal lesion. Other: None. CT MAXILLOFACIAL FINDINGS Osseous: No evidence of maxillofacial fracture. The mandible is intact. The bilateral mandibular condyles are well-seated in the TMJs. Orbits: The bilateral orbits, including the globes and retroconal soft tissues, are within normal limits. Sinuses: The visualized paranasal sinuses are essentially clear. The mastoid air cells are unopacified. Soft tissues: Negative. CT CERVICAL SPINE FINDINGS Alignment: Normal cervical lordosis. Skull base and vertebrae: No acute fracture. No primary bone lesion or focal pathologic process. Soft tissues and spinal canal: No prevertebral fluid or swelling. No visible canal hematoma. Disc levels: Vertebra body heights and intervertebral disc spaces are maintained. Spinal canal is patent. Upper chest: Visualized lung apices are clear. Other: Visualized thyroid is unremarkable. IMPRESSION: No evidence of acute intracranial abnormality. Small vessel ischemic changes. No evidence of maxillofacial fracture. Negative cervical spine CT. Electronically Signed   By: Charline Bills M.D.   On: 09/28/2017 16:07    ____________________________________________    PROCEDURES  Procedure(s) performed:    Procedures  LACERATION REPAIR Performed by: Orvil Feil Authorized by: Orvil Feil Consent: Verbal consent obtained. Risks and benefits: risks, benefits and alternatives were discussed Consent given by: patient Patient identity confirmed: provided demographic data Prepped and  Draped in normal sterile  fashion Wound explored  Laceration Location: Upper Interior Lip  Laceration Length: 1 cm  No Foreign Bodies seen or palpated  Anesthesia: LET  Anesthetic total: 3 ml  Irrigation method: syringe Amount of cleaning: standard  Skin closure: 5-0 Monocryl  Number of sutures: 4  Technique: Simple Interrupted   Patient tolerance: Patient tolerated the procedure well with no immediate complications.   Medications  lidocaine-EPINEPHrine-tetracaine (LET) solution (not administered)  lidocaine (PF) (XYLOCAINE) 1 % injection (not administered)  lidocaine-EPINEPHrine-tetracaine (LET) solution (3 mLs  Given 09/28/17 1656)     ____________________________________________   INITIAL IMPRESSION / ASSESSMENT AND PLAN / ED COURSE  Pertinent labs & imaging results that were available during my care of the patient were reviewed by me and considered in my medical decision making (see chart for details).  Review of the [place] CSRS was performed in accordance of the [facility] prior to dispensing any controlled drugs.     Assessment and Plan:  Fall  Facial Laceration:  Patient presents to the emergency department with a 1 cm upper interior lip laceration after falling at a gas station approximately 3 hours ago.  Differential diagnosis originally included subdural hematoma versus facial fracture versus facial contusion.  Neurologic exam was completely reassuring. CT examination revealed no acute abnormality.  Patient underwent laceration repair without complication.  She was advised to have sutures removed by primary care in 5 days.  Patient voiced understanding regarding this recommendation.  Vital signs are reassuring prior to discharge.  All patient questions were answered.    ____________________________________________  FINAL CLINICAL IMPRESSION(S) / ED DIAGNOSES  Final diagnoses:  Fall, initial encounter  Facial laceration, initial encounter      NEW MEDICATIONS STARTED DURING THIS  VISIT:  ED Discharge Orders        Ordered    cephALEXin (KEFLEX) 500 MG capsule  3 times daily     09/28/17 1759          This chart was dictated using voice recognition software/Dragon. Despite best efforts to proofread, errors can occur which can change the meaning. Any change was purely unintentional.    Orvil FeilWoods, Olivia Pavelko M, PA-C 09/28/17 Gregery [id number]    Kinner, Robert, MD 09/28/17 [phone]

## 2017-09-28 NOTE — ED Triage Notes (Signed)
Pt reports tripping and falling at gas station a couple of hours ago, bruising and lacerations to face, reports laceration in mouth, some bleeding from left nostril. Denies use of blood thinners.

## 2018-08-14 ENCOUNTER — Other Ambulatory Visit: Payer: Self-pay | Admitting: Internal Medicine

## 2018-08-14 DIAGNOSIS — Z1231 Encounter for screening mammogram for malignant neoplasm of breast: Secondary | ICD-10-CM

## 2018-09-04 ENCOUNTER — Ambulatory Visit
Admission: RE | Admit: 2018-09-04 | Discharge: 2018-09-04 | Disposition: A | Discharge status: Home / Self Care | Payer: Medicare Other | Source: Ambulatory Visit | Attending: Internal Medicine | Admitting: Internal Medicine

## 2018-09-04 DIAGNOSIS — Z1231 Encounter for screening mammogram for malignant neoplasm of breast: Secondary | ICD-10-CM | POA: Diagnosis present

## 2019-06-06 ENCOUNTER — Other Ambulatory Visit: Payer: Self-pay

## 2019-06-06 DIAGNOSIS — Z7984 Long term (current) use of oral hypoglycemic drugs: Secondary | ICD-10-CM | POA: Diagnosis not present

## 2019-06-06 DIAGNOSIS — Z79899 Other long term (current) drug therapy: Secondary | ICD-10-CM | POA: Insufficient documentation

## 2019-06-06 DIAGNOSIS — R51 Headache: Secondary | ICD-10-CM | POA: Insufficient documentation

## 2019-06-06 DIAGNOSIS — R42 Dizziness and giddiness: Secondary | ICD-10-CM | POA: Insufficient documentation

## 2019-06-06 DIAGNOSIS — E119 Type 2 diabetes mellitus without complications: Secondary | ICD-10-CM | POA: Insufficient documentation

## 2019-06-06 DIAGNOSIS — I10 Essential (primary) hypertension: Secondary | ICD-10-CM | POA: Diagnosis not present

## 2019-06-06 LAB — CBC WITH DIFFERENTIAL/PLATELET
Abs Immature Granulocytes: 0.03 10*3/uL (ref 0.00–0.07)
Basophils Absolute: 0 10*3/uL (ref 0.0–0.1)
Basophils Relative: 0 %
Eosinophils Absolute: 0.1 10*3/uL (ref 0.0–0.5)
Eosinophils Relative: 2 %
HCT: 36 % (ref 36.0–46.0)
Hemoglobin: 11.2 g/dL — ABNORMAL LOW (ref 12.0–15.0)
Immature Granulocytes: 1 %
Lymphocytes Relative: 50 %
Lymphs Abs: 2.9 10*3/uL (ref 0.7–4.0)
MCH: 25.5 pg — ABNORMAL LOW (ref 26.0–34.0)
MCHC: 31.1 g/dL (ref 30.0–36.0)
MCV: 82 fL (ref 80.0–100.0)
Monocytes Absolute: 0.3 10*3/uL (ref 0.1–1.0)
Monocytes Relative: 6 %
Neutro Abs: 2.4 10*3/uL (ref 1.7–7.7)
Neutrophils Relative %: 41 %
Platelets: 111 10*3/uL — ABNORMAL LOW (ref 150–400)
RBC: 4.39 MIL/uL (ref 3.87–5.11)
RDW: 14.9 % (ref 11.5–15.5)
WBC: 5.8 10*3/uL (ref 4.0–10.5)
nRBC: 0 % (ref 0.0–0.2)

## 2019-06-06 LAB — URINALYSIS, COMPLETE (UACMP) WITH MICROSCOPIC
Bacteria, UA: NONE SEEN
Bilirubin Urine: NEGATIVE
Glucose, UA: NEGATIVE mg/dL
Hgb urine dipstick: NEGATIVE
Ketones, ur: NEGATIVE mg/dL
Nitrite: NEGATIVE
Protein, ur: 100 mg/dL — AB
Specific Gravity, Urine: 1.024 (ref 1.005–1.030)
pH: 5 (ref 5.0–8.0)

## 2019-06-06 LAB — COMPREHENSIVE METABOLIC PANEL
ALT: 40 U/L (ref 0–44)
AST: 45 U/L — ABNORMAL HIGH (ref 15–41)
Albumin: 4.3 g/dL (ref 3.5–5.0)
Alkaline Phosphatase: 70 U/L (ref 38–126)
Anion gap: 13 (ref 5–15)
BUN: 25 mg/dL — ABNORMAL HIGH (ref 8–23)
CO2: 20 mmol/L — ABNORMAL LOW (ref 22–32)
Calcium: 10.6 mg/dL — ABNORMAL HIGH (ref 8.9–10.3)
Chloride: 107 mmol/L (ref 98–111)
Creatinine, Ser: 0.7 mg/dL (ref 0.44–1.00)
GFR calc Af Amer: >60 mL/min (ref >60)
GFR calc non Af Amer: >60 mL/min (ref >60)
Glucose, Bld: 202 mg/dL — ABNORMAL HIGH (ref 70–99)
Potassium: 4.1 mmol/L (ref 3.5–5.1)
Sodium: 140 mmol/L (ref 135–145)
Total Bilirubin: 0.7 mg/dL (ref 0.3–1.2)
Total Protein: 7.8 g/dL (ref 6.5–8.1)

## 2019-06-06 NOTE — ED Triage Notes (Signed)
Pt states today "was stumbling getting out of a chair" at 1430. Pt states her daughter took her blood pressure and it was elevated. Pt states she has been "stumbly" for weeks. Pt states she has no pain currently and "feels fine" right now.

## 2019-06-07 ENCOUNTER — Other Ambulatory Visit: Payer: Self-pay

## 2019-06-07 ENCOUNTER — Emergency Department
Admission: EM | Admit: 2019-06-07 | Discharge: 2019-06-07 | Disposition: A | Discharge status: Home / Self Care | Payer: Medicare PPO | Attending: Emergency Medicine | Admitting: Emergency Medicine

## 2019-06-07 ENCOUNTER — Emergency Department: Payer: Medicare PPO

## 2019-06-07 DIAGNOSIS — R42 Dizziness and giddiness: Secondary | ICD-10-CM

## 2019-06-07 DIAGNOSIS — I10 Essential (primary) hypertension: Secondary | ICD-10-CM

## 2019-06-07 NOTE — ED Notes (Signed)
No peripheral IV placed this visit.   Discharge instructions reviewed with patient. Questions fielded by this RN. Patient verbalizes understanding of instructions. Patient discharged home in stable condition per goodman. No acute distress noted at time of discharge.   Pt wheeled and loaded in family car

## 2019-06-07 NOTE — Discharge Instructions (Addendum)
Please seek medical attention for any high fevers, chest pain, shortness of breath, change in behavior, persistent vomiting, bloody stool or any other new or concerning symptoms.  

## 2019-06-07 NOTE — ED Provider Notes (Addendum)
[facility] Emergency Department Provider Note   ____________________________________________   I have reviewed the triage vital signs and the nursing notes.   HISTORY  Chief Complaint Dizziness  History limited by: Not Limited   HPI Monica Hess is a 78 y.o. female who presents to the emergency department today because of concern for episode of dizziness. The patient states that she went to get up out of a chair earlier today when she felt dizzy. Felt like her vision was going black and she sat back down on the chair. She did have some associated headache. Denies it being severe or worst of her life.   Records reviewed. Per medical record review patient has a history of DM, HLD, HTN.   Past Medical History:  Diagnosis Date  . Back pain   . Depression   . Diabetes mellitus without complication (HCC)   . GERD (gastroesophageal reflux disease)   . Hypercholesterolemia   . Hypertension     There are no active problems to display for this patient.   Past Surgical History:  Procedure Laterality Date  . APPENDECTOMY    . COLONOSCOPY    . COLONOSCOPY WITH PROPOFOL N/A 07/25/2015   Procedure: COLONOSCOPY WITH PROPOFOL;  Surgeon: Scot Junobert T Elliott, MD;  [facility]COPY;  Service: Endoscopy;  Laterality: N/A;  . ESOPHAGOGASTRODUODENOSCOPY    . ESOPHAGOGASTRODUODENOSCOPY (EGD) WITH PROPOFOL N/A 07/25/2015   Procedure: ESOPHAGOGASTRODUODENOSCOPY (EGD) WITH PROPOFOL;  Surgeon: Scot Junobert T Elliott, MD;  [facility];  Service: Endoscopy;  Laterality: N/A;  . EYE SURGERY      Prior to Admission medications   Medication Sig Start Date End Date Taking? Authorizing Provider  amLODipine-olmesartan (AZOR) 5-40 MG tablet Take 1 tablet by mouth daily.    [provider]  cholestyramine Lanetta Inch(QUESTRAN) 4 G packet Take 4 g by mouth 3 (three) times daily with meals.    [provider]  estrogens, conjugated, (PREMARIN) 0.3 MG tablet Take  0.3 mg by mouth daily. Take daily for 21 days then do not take for 7 days.    [provider]  fluticasone (FLONASE) 50 MCG/ACT nasal spray Place into both nostrils daily.    [provider]  glipiZIDE (GLUCOTROL) 5 MG tablet Take by mouth daily before breakfast.    [provider]  metFORMIN (GLUCOPHAGE) 500 MG tablet Take by mouth 2 (two) times daily with a meal.    [provider]  omeprazole (PRILOSEC) 20 MG capsule Take 20 mg by mouth daily.    [provider]    Allergies Codeine sulfate, Lipitor [atorvastatin], Nitrofurantoin, and Vytorin [ezetimibe-simvastatin]  Family History  Problem Relation Age of Onset  . Breast cancer Neg Hx     Social History Social History   Tobacco Use  . Smoking status: Not on file  Substance Use Topics  . Alcohol use: Not on file  . Drug use: Not on file    Review of Systems Constitutional: No fever/chills Eyes: No visual changes. ENT: No sore throat. Cardiovascular: Denies chest pain. Respiratory: Denies shortness of breath. Gastrointestinal: No abdominal pain.  No nausea, no vomiting.  No diarrhea.   Genitourinary: Negative for dysuria. Musculoskeletal: Negative for back pain. Skin: Negative for rash. Neurological: Positive for headache. Positive for dizziness.  ____________________________________________   PHYSICAL EXAM:  VITAL SIGNS: ED Triage Vitals  Enc Vitals Group     BP 06/06/19 2231 (!) 153/79     Pulse Rate 06/06/19 2231 83     Resp  06/06/19 2231 16     Temp 06/06/19 2231 98.1 F (36.7 C)     Temp Source 06/06/19 2231 Oral     SpO2 06/06/19 2231 97 %     Weight 06/06/19 2232 170 lb (77.1 kg)     Height 06/06/19 2232 5\' 7"  (1.702 m)     Head Circumference --      Peak Flow --      Pain Score 06/06/19 2231 0   Constitutional: Alert and oriented.  Eyes: Conjunctivae are normal.  ENT      Head: Normocephalic and atraumatic.      Nose: No congestion/rhinnorhea.       Mouth/Throat: Mucous membranes are moist.      Neck: No stridor. Hematological/Lymphatic/Immunilogical: No cervical lymphadenopathy. Cardiovascular: Normal rate, regular rhythm.  No murmurs, rubs, or gallops.  Respiratory: Normal respiratory effort without tachypnea nor retractions. Breath sounds are clear and equal bilaterally. No wheezes/rales/rhonchi. Gastrointestinal: Soft and non tender. No rebound. No guarding.  Genitourinary: Deferred Musculoskeletal: Normal range of motion in all extremities. No lower extremity edema. Neurologic:  Normal speech and language. EOMI. PERRL. Strength 5/5 in upper and lower extremities. Sensation intact. No gross focal neurologic deficits are appreciated.  Skin:  Skin is warm, dry and intact. No rash noted. Psychiatric: Mood and affect are normal. Speech and behavior are normal. Patient exhibits appropriate insight and judgment.  ____________________________________________    LABS (pertinent positives/negatives)  CBC wbc 5.8, hgb 11.2, plt 111 CMP na 140, k 4.1, glu 202, cr 0.70 UA clear, protein 100, small leukocytes, 6-10 wbc, bacteria none seen ____________________________________________   EKG  I, Nance Pear, attending physician, personally viewed and interpreted this EKG  EKG Time: 2245 Rate: 76 Rhythm: normal sinus rhythm Axis: left axis deviation Intervals: qtc 414 QRS: narrow ST changes: no st elevation Impression: abnormal ekg   ____________________________________________    RADIOLOGY  CT head No acute abnormality  ____________________________________________   PROCEDURES  Procedures  ____________________________________________   INITIAL IMPRESSION / ASSESSMENT AND PLAN / ED COURSE  Pertinent labs & imaging results that were available during my care of the patient were reviewed by me and considered in my medical decision making (see chart for details).   Patient presented to the emergency department today  because of concerns for some dizziness and high blood pressure.  Patient also complained of some headache.  On exam patient appears well.  Neurologically intact.  Head CT without any concerning findings.  While patient's blood pressure was high in the emergency department was not significantly elevated.  Discussion with patient.  At this point I doubt patient had to cranial hemorrhage secondary to high blood pressure.  Discussed with patient portance of following up.   ____________________________________________   FINAL CLINICAL IMPRESSION(S) / ED DIAGNOSES  Final diagnoses:  Dizziness  Hypertension, unspecified type     Note: This dictation was prepared with Dragon dictation. Any transcriptional errors that result from this process are unintentional     Nance Pear, MD 06/07/19 [id number]    Nance Pear, MD 06/22/19 [phone]

## 2019-06-07 NOTE — ED Notes (Signed)
Patient transported to CT 

## 2019-06-07 NOTE — ED Notes (Signed)
Daughter, called att, phone given to pt, all updated

## 2019-06-07 NOTE — ED Notes (Signed)
Pt reports high BP earlier today: 197/76 and then 210/91 left arm and 208/77  On the right arm  Pt reports att of high BP pt got dizzy and HA left side  S/sx now resolved  Hx of DM, HTN

## 2019-06-07 NOTE — ED Notes (Addendum)
ED Provider at bedside. 

## 2019-06-07 NOTE — ED Notes (Signed)
Attempted to update patient's daughter with her permission that patient was taken to a treatment room, # went to voice mail.  No message left.

## 2019-06-08 LAB — URINE CULTURE: Culture: 80000 — AB

## 2019-06-09 NOTE — Progress Notes (Signed)
Discussed pt with Dr. Jimmye Norman. UA was unremarkable and patient did not present in the ED with symptoms consistent for a UTI. Per Dr. Jimmye Norman, if patient has symptoms that an antibiotic could be prescribed.   Monica Hess was called today to see how she was doing. She denied fever, chills, polyuria, dysuria, urgency, and back pain (baseline back pain prior to being seen in ED but does not feel it is related to a UTI). Overall, patient reports that she is feeling fine. For these reasons, a repeat urine sample was not indicated and the patient did not need antibiotics.   Thank you for allowing pharmacy to be a part of this patient's care.   Kristeen Miss, PharmD Clinical Pharmacist

## 2019-07-29 ENCOUNTER — Other Ambulatory Visit: Payer: Self-pay | Admitting: Internal Medicine

## 2019-07-29 DIAGNOSIS — Z1231 Encounter for screening mammogram for malignant neoplasm of breast: Secondary | ICD-10-CM

## 2019-09-07 ENCOUNTER — Ambulatory Visit
Admission: RE | Admit: 2019-09-07 | Discharge: 2019-09-07 | Disposition: A | Discharge status: Home / Self Care | Payer: Medicare PPO | Source: Ambulatory Visit | Attending: Internal Medicine | Admitting: Internal Medicine

## 2019-09-07 DIAGNOSIS — Z1231 Encounter for screening mammogram for malignant neoplasm of breast: Secondary | ICD-10-CM | POA: Insufficient documentation

## 2020-08-04 ENCOUNTER — Other Ambulatory Visit: Payer: Self-pay | Admitting: Internal Medicine

## 2020-08-04 DIAGNOSIS — Z1231 Encounter for screening mammogram for malignant neoplasm of breast: Secondary | ICD-10-CM

## 2020-09-12 ENCOUNTER — Ambulatory Visit
Admission: RE | Admit: 2020-09-12 | Discharge: 2020-09-12 | Disposition: A | Discharge status: Home / Self Care | Payer: Medicare PPO | Source: Ambulatory Visit | Attending: Internal Medicine | Admitting: Internal Medicine

## 2020-09-12 ENCOUNTER — Other Ambulatory Visit: Payer: Self-pay

## 2020-09-12 DIAGNOSIS — Z1231 Encounter for screening mammogram for malignant neoplasm of breast: Secondary | ICD-10-CM | POA: Insufficient documentation

## 2021-03-01 ENCOUNTER — Other Ambulatory Visit: Payer: Self-pay

## 2021-03-01 ENCOUNTER — Emergency Department
Admission: EM | Admit: 2021-03-01 | Discharge: 2021-03-01 | Disposition: A | Discharge status: Home / Self Care | Payer: Medicare PPO | Attending: Emergency Medicine | Admitting: Emergency Medicine

## 2021-03-01 ENCOUNTER — Emergency Department: Payer: Medicare PPO

## 2021-03-01 DIAGNOSIS — Z87891 Personal history of nicotine dependence: Secondary | ICD-10-CM | POA: Insufficient documentation

## 2021-03-01 DIAGNOSIS — S0181XA Laceration without foreign body of other part of head, initial encounter: Secondary | ICD-10-CM | POA: Diagnosis not present

## 2021-03-01 DIAGNOSIS — I10 Essential (primary) hypertension: Secondary | ICD-10-CM | POA: Diagnosis not present

## 2021-03-01 DIAGNOSIS — I951 Orthostatic hypotension: Secondary | ICD-10-CM | POA: Insufficient documentation

## 2021-03-01 DIAGNOSIS — E119 Type 2 diabetes mellitus without complications: Secondary | ICD-10-CM | POA: Diagnosis not present

## 2021-03-01 DIAGNOSIS — E860 Dehydration: Secondary | ICD-10-CM | POA: Insufficient documentation

## 2021-03-01 DIAGNOSIS — Z7984 Long term (current) use of oral hypoglycemic drugs: Secondary | ICD-10-CM | POA: Insufficient documentation

## 2021-03-01 DIAGNOSIS — W01198A Fall on same level from slipping, tripping and stumbling with subsequent striking against other object, initial encounter: Secondary | ICD-10-CM | POA: Diagnosis not present

## 2021-03-01 DIAGNOSIS — Z23 Encounter for immunization: Secondary | ICD-10-CM | POA: Insufficient documentation

## 2021-03-01 DIAGNOSIS — S0990XA Unspecified injury of head, initial encounter: Secondary | ICD-10-CM | POA: Diagnosis present

## 2021-03-01 DIAGNOSIS — Z79899 Other long term (current) drug therapy: Secondary | ICD-10-CM | POA: Diagnosis not present

## 2021-03-01 LAB — BASIC METABOLIC PANEL
Anion gap: 10 (ref 5–15)
BUN: 25 mg/dL — ABNORMAL HIGH (ref 8–23)
CO2: 19 mmol/L — ABNORMAL LOW (ref 22–32)
Calcium: 9.9 mg/dL (ref 8.9–10.3)
Chloride: 109 mmol/L (ref 98–111)
Creatinine, Ser: 0.86 mg/dL (ref 0.44–1.00)
GFR, Estimated: >60 mL/min (ref >60)
Glucose, Bld: 125 mg/dL — ABNORMAL HIGH (ref 70–99)
Potassium: 3.9 mmol/L (ref 3.5–5.1)
Sodium: 138 mmol/L (ref 135–145)

## 2021-03-01 LAB — CBC
HCT: 31.4 % — ABNORMAL LOW (ref 36.0–46.0)
Hemoglobin: 9.5 g/dL — ABNORMAL LOW (ref 12.0–15.0)
MCH: 23.9 pg — ABNORMAL LOW (ref 26.0–34.0)
MCHC: 30.3 g/dL (ref 30.0–36.0)
MCV: 79.1 fL — ABNORMAL LOW (ref 80.0–100.0)
Platelets: 122 10*3/uL — ABNORMAL LOW (ref 150–400)
RBC: 3.97 MIL/uL (ref 3.87–5.11)
RDW: 16.7 % — ABNORMAL HIGH (ref 11.5–15.5)
WBC: 6.2 10*3/uL (ref 4.0–10.5)
nRBC: 0 % (ref 0.0–0.2)

## 2021-03-01 LAB — TROPONIN I (HIGH SENSITIVITY): Troponin I (High Sensitivity): 5 ng/L (ref <18)

## 2021-03-01 LAB — CBG MONITORING, ED: Glucose-Capillary: 126 mg/dL — ABNORMAL HIGH (ref 70–99)

## 2021-03-01 MED ORDER — SODIUM CHLORIDE 0.9 % IV BOLUS
500.0000 mL | Freq: Once | INTRAVENOUS | Status: AC
  Administered 2021-03-01: 500 mL via INTRAVENOUS

## 2021-03-01 MED ORDER — TETANUS-DIPHTH-ACELL PERTUSSIS 5-2.5-18.5 LF-MCG/0.5 IM SUSY
0.5000 mL | PREFILLED_SYRINGE | Freq: Once | INTRAMUSCULAR | Status: AC
  Administered 2021-03-01: 0.5 mL via INTRAMUSCULAR
  Filled 2021-03-01: qty 0.5

## 2021-03-01 NOTE — ED Provider Notes (Signed)
[facility] Emergency Department Provider Note ____________________________________________   Event Date/Time   First MD Initiated Contact with Patient 03/01/21 1635     (approximate)  I have reviewed the triage vital signs and the nursing notes.   HISTORY  Chief Complaint Loss of Consciousness    HPI Monica Hess is a 80 y.o. female here with loss of consciousness.  The patient states that earlier today, she bent down to get something off the floor.  She believes she fell forward, striking her face.  She sustained a bruise to her face as well as superficial skin tear.  She states that she does feel occasionally lightheaded with standing, but otherwise has been well.  No recent medication changes.  Denies any recent fevers or chills.  She currently feels fine other than a mild, aching, throbbing, nasal pain.  No alleviating factors.  She did have some epistaxis which is resolved.  No other complaints.        Past Medical History:  Diagnosis Date  . Back pain   . Depression   . Diabetes mellitus without complication (HCC)   . GERD (gastroesophageal reflux disease)   . Hypercholesterolemia   . Hypertension     There are no problems to display for this patient.   Past Surgical History:  Procedure Laterality Date  . APPENDECTOMY    . COLONOSCOPY    . COLONOSCOPY WITH PROPOFOL N/A 07/25/2015   Procedure: COLONOSCOPY WITH PROPOFOL;  Surgeon: Scot Jun, MD;  Location: [facility] ENDOSCOPY;  Service: Endoscopy;  Laterality: N/A;  . ESOPHAGOGASTRODUODENOSCOPY    . ESOPHAGOGASTRODUODENOSCOPY (EGD) WITH PROPOFOL N/A 07/25/2015   Procedure: ESOPHAGOGASTRODUODENOSCOPY (EGD) WITH PROPOFOL;  Surgeon: Scot Jun, MD;  Location: [facility] ENDOSCOPY;  Service: Endoscopy;  Laterality: N/A;  . EYE SURGERY      Prior to Admission medications   Medication Sig Start Date End Date Taking? Authorizing Provider  amLODipine-olmesartan (AZOR) 5-40 MG tablet Take 1  tablet by mouth daily.    [provider]  cholestyramine Lanetta Inch) 4 G packet Take 4 g by mouth 3 (three) times daily with meals.    [provider]  estrogens, conjugated, (PREMARIN) 0.3 MG tablet Take 0.3 mg by mouth daily. Take daily for 21 days then do not take for 7 days.    [provider]  fluticasone (FLONASE) 50 MCG/ACT nasal spray Place into both nostrils daily.    [provider]  glipiZIDE (GLUCOTROL) 5 MG tablet Take by mouth daily before breakfast.    [provider]  metFORMIN (GLUCOPHAGE) 500 MG tablet Take by mouth 2 (two) times daily with a meal.    [provider]  omeprazole (PRILOSEC) 20 MG capsule Take 20 mg by mouth daily.    [provider]    Allergies Codeine sulfate, Lipitor [atorvastatin], Nitrofurantoin, and Vytorin [ezetimibe-simvastatin]  Family History  Problem Relation Age of Onset  . Breast cancer Neg Hx     Social History Social History   Tobacco Use  . Smoking status: Former Smoker  Substance Use Topics  . Alcohol use: Not Currently    Review of Systems  Review of Systems  Constitutional: Negative for fatigue and fever.  HENT: Negative for congestion and sore throat.   Eyes: Negative for visual disturbance.  Respiratory: Negative for cough and shortness of breath.   Cardiovascular: Negative for chest pain.  Gastrointestinal: Negative for abdominal pain, diarrhea, nausea and vomiting.  Genitourinary: Negative for flank pain.  Musculoskeletal: Positive  for arthralgias. Negative for back pain and neck pain.  Skin: Negative for rash and wound.  Neurological: Positive for syncope (Possible) and light-headedness. Negative for weakness.  All other systems reviewed and are negative.    ____________________________________________  PHYSICAL EXAM:      VITAL SIGNS: ED Triage Vitals  Enc Vitals Group     BP 03/01/21 1429 (!) 136/57     Pulse Rate 03/01/21 1429 71     Resp  03/01/21 1429 16     Temp 03/01/21 1429 97.7 F (36.5 C)     Temp Source 03/01/21 1429 Oral     SpO2 03/01/21 1429 97 %     Weight 03/01/21 1430 170 lb (77.1 kg)     Height 03/01/21 1430 5\' 7"  (1.702 m)     Head Circumference --      Peak Flow --      Pain Score 03/01/21 1430 2     Pain Loc --      Pain Edu? --      Excl. in GC? --      Physical Exam Vitals and nursing note reviewed.  Constitutional:      General: She is not in acute distress.    Appearance: She is well-developed.  HENT:     Head: Normocephalic and atraumatic.     Comments: Mild swelling to the upper nasal bridge.  No epistaxis.  No septal hematoma.  No periorbital or postauricular ecchymoses. Eyes:     Conjunctiva/sclera: Conjunctivae normal.  Neck:     Comments: No midline or paraspinal tenderness. Cardiovascular:     Rate and Rhythm: Normal rate and regular rhythm.     Heart sounds: Normal heart sounds. No murmur heard. No friction rub.  Pulmonary:     Effort: Pulmonary effort is normal. No respiratory distress.     Breath sounds: Normal breath sounds. No wheezing or rales.  Abdominal:     General: There is no distension.     Palpations: Abdomen is soft.     Tenderness: There is no abdominal tenderness.  Musculoskeletal:     Cervical back: Neck supple.  Skin:    General: Skin is warm.     Capillary Refill: Capillary refill takes less than 2 seconds.  Neurological:     Mental Status: She is alert and oriented to person, place, and time.     Motor: No abnormal muscle tone.       ____________________________________________   LABS (all labs ordered are listed, but only abnormal results are displayed)  Labs Reviewed  BASIC METABOLIC PANEL - Abnormal; Notable for the following components:      Result Value   CO2 19 (*)    Glucose, Bld 125 (*)    BUN 25 (*)    All other components within normal limits  CBC - Abnormal; Notable for the following components:   Hemoglobin 9.5 (*)    HCT 31.4  (*)    MCV 79.1 (*)    MCH 23.9 (*)    RDW 16.7 (*)    Platelets 122 (*)    All other components within normal limits  CBG MONITORING, ED - Abnormal; Notable for the following components:   Glucose-Capillary 126 (*)    All other components within normal limits  URINALYSIS, COMPLETE (UACMP) WITH MICROSCOPIC  TROPONIN I (HIGH SENSITIVITY)    ____________________________________________  EKG: Normal sinus rhythm, ventricular rate 73.  PR 140, QRS 90, QTc 416.  No acute ST elevations or depressions. ________________________________________  RADIOLOGY All imaging, including plain films, CT scans, and ultrasounds, independently reviewed by me, and interpretations confirmed via formal radiology reads.  ED MD interpretation:   CT head/C-spine: No acute fractures CT face: Negative  Official radiology report(s): CT Head Wo Contrast  Result Date: 03/01/2021 CLINICAL DATA:  Head trauma.  Syncopal episode. EXAM: CT HEAD WITHOUT CONTRAST TECHNIQUE: Contiguous axial images were obtained from the base of the skull through the vertex without intravenous contrast. COMPARISON:  June 07, 2019 FINDINGS: Brain: No evidence of acute infarction, hemorrhage, hydrocephalus, extra-axial collection or mass lesion/mass effect. Mild brain parenchymal volume loss and deep white matter microangiopathy. Vascular: Calcific atherosclerotic disease of the intra cavernous carotid arteries. Skull: Normal. Negative for fracture or focal lesion. Sinuses/Orbits: No acute finding. Other: None. IMPRESSION: 1. No acute intracranial abnormality. 2. Mild brain parenchymal atrophy and chronic microvascular disease. Electronically Signed   By: Ted Mcalpine M.D.   On: 03/01/2021 18:06   CT Cervical Spine Wo Contrast  Result Date: 03/01/2021 CLINICAL DATA:  Neck trauma, status post fall. EXAM: CT CERVICAL SPINE WITHOUT CONTRAST TECHNIQUE: Multidetector CT imaging of the cervical spine was performed without intravenous  contrast. Multiplanar CT image reconstructions were also generated. COMPARISON:  September 28, 2017 FINDINGS: Alignment: Normal. Skull base and vertebrae: No acute fracture. No primary bone lesion or focal pathologic process. Soft tissues and spinal canal: No prevertebral fluid or swelling. No visible canal hematoma. 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 Upper chest: Negative. Other: None. IMPRESSION: 1. No evidence of acute traumatic injury to the cervical spine. 2. 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 Electronically Signed   By: Ted Mcalpine M.D.   On: 03/01/2021 18:09   CT Maxillofacial Wo Contrast  Result Date: 03/01/2021 CLINICAL DATA:  Status post syncopal episode and fall. Facial trauma. EXAM: CT MAXILLOFACIAL WITHOUT CONTRAST TECHNIQUE: Multidetector CT imaging of the maxillofacial structures was performed. Multiplanar CT image reconstructions were also generated. COMPARISON:  September 28, 2017 FINDINGS: Osseous: No fracture or mandibular dislocation. No destructive process. Orbits: Negative. No traumatic or inflammatory finding. Sinuses: Clear. Soft tissues: Negative. Limited intracranial: No significant or unexpected finding. IMPRESSION: No evidence of facial fractures. Electronically Signed   By: Ted Mcalpine M.D.   On: 03/01/2021 18:21    ____________________________________________  PROCEDURES   Procedure(s) performed (including Critical Care):  Procedures  ____________________________________________  INITIAL IMPRESSION / MDM / ASSESSMENT AND PLAN / ED COURSE  As part of my medical decision making, I reviewed the following data within the electronic MEDICAL RECORD NUMBER Nursing notes reviewed and incorporated, Old chart reviewed, Notes from prior ED visits, and [place] Controlled Substance Database       *DWAYNA KENTNER was evaluated in Emergency Department on 03/01/2021 for the symptoms described in the history of present  illness. She was evaluated in the context of the global COVID-19 pandemic, which necessitated consideration that the patient might be at risk for infection with the SARS-CoV-2 virus that causes COVID-19. Institutional protocols and algorithms that pertain to the evaluation of patients at risk for COVID-19 are in a state of rapid change based on information released by regulatory bodies including the CDC and federal and state organizations. These policies and algorithms were followed during the patient's care in the ED.  Some ED evaluations and interventions may be delayed as a result of limited staffing during the pandemic.*     Medical Decision Making: Well-appearing 80 year old female here with syncopal episode versus loss of balance  and fall forward.  She does appear mildly orthostatic here, with elevated BUN to creatinine ratio and I suspect she is clinically dehydrated.  Patient given fluids and feels better here.  Lab work is otherwise reassuring.  Troponin negative, EKG nonischemic.  CT head, C-spine, face reviewed by me and are unremarkable.  Regarding her nose, she has a small hematoma but no evidence of fracture.  No evidence of septal hematoma.  No epistaxis.  Patient able to ambulate in the ED without difficulty and is otherwise at her baseline state of health.  Will discharge with encouraged hydration, PCP follow-up, and good return precautions.  ____________________________________________  FINAL CLINICAL IMPRESSION(S) / ED DIAGNOSES  Final diagnoses:  Dehydration  Orthostasis     MEDICATIONS GIVEN DURING THIS VISIT:  Medications  sodium chloride 0.9 % bolus 500 mL (0 mLs Intravenous Stopped 03/01/21 1824)  Tdap (BOOSTRIX) injection 0.5 mL (0.5 mLs Intramuscular Given 03/01/21 1855)     ED Discharge Orders    None       Note:  This document was prepared using Dragon voice recognition software and may include unintentional dictation errors.   Shaune PollackIsaacs, Emry Barbato, MD 03/01/21  [phone]

## 2021-03-01 NOTE — ED Triage Notes (Signed)
Pt arrives to ER for syncopal episode. Remembers bending over to grab a dog bone and woke up on floor in hallway. Skin tear to R elbow. C/o nasal bane. Wearing glasses. Glasses intact. Type 2 DM. Takes metformin. A&O, speech clear. In wheelchair.

## 2021-03-01 NOTE — Discharge Instructions (Addendum)
Make sure you drink plenty of fluids.  Try to monitor your urine, it should be clear to indicate hydration.  Be very careful when going from sitting to standing.  V continue have issues with lightheadedness when bending forward or standing up, call your doctor.  You may need to decrease your  blood pressure medications.

## 2021-09-08 ENCOUNTER — Other Ambulatory Visit: Payer: Self-pay | Admitting: Internal Medicine

## 2021-09-08 DIAGNOSIS — Z1231 Encounter for screening mammogram for malignant neoplasm of breast: Secondary | ICD-10-CM

## 2021-09-21 ENCOUNTER — Ambulatory Visit
Admission: RE | Admit: 2021-09-21 | Discharge: 2021-09-21 | Disposition: A | Discharge status: Home / Self Care | Payer: Medicare PPO | Source: Ambulatory Visit | Attending: Internal Medicine | Admitting: Internal Medicine

## 2021-09-21 ENCOUNTER — Other Ambulatory Visit: Payer: Self-pay

## 2021-09-21 DIAGNOSIS — Z1231 Encounter for screening mammogram for malignant neoplasm of breast: Secondary | ICD-10-CM | POA: Insufficient documentation

## 2021-10-02 ENCOUNTER — Other Ambulatory Visit: Payer: Self-pay | Admitting: Internal Medicine

## 2021-10-02 DIAGNOSIS — R928 Other abnormal and inconclusive findings on diagnostic imaging of breast: Secondary | ICD-10-CM

## 2021-10-02 DIAGNOSIS — N6489 Other specified disorders of breast: Secondary | ICD-10-CM

## 2021-10-03 ENCOUNTER — Ambulatory Visit
Admission: RE | Admit: 2021-10-03 | Discharge: 2021-10-03 | Disposition: A | Discharge status: Home / Self Care | Payer: Medicare PPO | Source: Ambulatory Visit | Attending: Internal Medicine | Admitting: Internal Medicine

## 2021-10-03 ENCOUNTER — Other Ambulatory Visit: Payer: Self-pay

## 2021-10-03 DIAGNOSIS — R928 Other abnormal and inconclusive findings on diagnostic imaging of breast: Secondary | ICD-10-CM | POA: Diagnosis present

## 2021-10-03 DIAGNOSIS — N6489 Other specified disorders of breast: Secondary | ICD-10-CM | POA: Insufficient documentation

## 2022-01-03 DIAGNOSIS — G720 Drug-induced myopathy: Secondary | ICD-10-CM | POA: Insufficient documentation

## 2022-11-20 ENCOUNTER — Other Ambulatory Visit: Payer: Self-pay | Admitting: Internal Medicine

## 2022-11-20 DIAGNOSIS — Z1231 Encounter for screening mammogram for malignant neoplasm of breast: Secondary | ICD-10-CM

## 2022-12-11 ENCOUNTER — Ambulatory Visit
Admission: RE | Admit: 2022-12-11 | Discharge: 2022-12-11 | Disposition: A | Discharge status: Home / Self Care | Payer: Medicare PPO | Source: Ambulatory Visit | Attending: Internal Medicine | Admitting: Internal Medicine

## 2022-12-11 DIAGNOSIS — Z1231 Encounter for screening mammogram for malignant neoplasm of breast: Secondary | ICD-10-CM | POA: Insufficient documentation

## 2023-01-11 ENCOUNTER — Emergency Department
Admission: EM | Admit: 2023-01-11 | Discharge: 2023-01-11 | Disposition: A | Discharge status: Home / Self Care | Payer: Medicare PPO | Attending: Emergency Medicine | Admitting: Emergency Medicine

## 2023-01-11 ENCOUNTER — Emergency Department: Payer: Medicare PPO

## 2023-01-11 ENCOUNTER — Other Ambulatory Visit: Payer: Self-pay

## 2023-01-11 DIAGNOSIS — R161 Splenomegaly, not elsewhere classified: Secondary | ICD-10-CM | POA: Diagnosis not present

## 2023-01-11 DIAGNOSIS — N390 Urinary tract infection, site not specified: Secondary | ICD-10-CM | POA: Insufficient documentation

## 2023-01-11 DIAGNOSIS — K449 Diaphragmatic hernia without obstruction or gangrene: Secondary | ICD-10-CM | POA: Diagnosis not present

## 2023-01-11 DIAGNOSIS — I700 Atherosclerosis of aorta: Secondary | ICD-10-CM | POA: Diagnosis not present

## 2023-01-11 DIAGNOSIS — R339 Retention of urine, unspecified: Secondary | ICD-10-CM | POA: Diagnosis present

## 2023-01-11 LAB — URINALYSIS, ROUTINE W REFLEX MICROSCOPIC
Bilirubin Urine: NEGATIVE
Glucose, UA: NEGATIVE mg/dL
Hgb urine dipstick: NEGATIVE
Ketones, ur: NEGATIVE mg/dL
Nitrite: NEGATIVE
Protein, ur: NEGATIVE mg/dL
Specific Gravity, Urine: 1.02 (ref 1.005–1.030)
pH: 5 (ref 5.0–8.0)

## 2023-01-11 LAB — HEPATIC FUNCTION PANEL
ALT: 23 U/L (ref 0–44)
AST: 32 U/L (ref 15–41)
Albumin: 4 g/dL (ref 3.5–5.0)
Alkaline Phosphatase: 89 U/L (ref 38–126)
Bilirubin, Direct: <0.1 mg/dL (ref 0.0–0.2)
Total Bilirubin: 0.6 mg/dL (ref 0.3–1.2)
Total Protein: 7.7 g/dL (ref 6.5–8.1)

## 2023-01-11 LAB — CBC
HCT: 32.2 % — ABNORMAL LOW (ref 36.0–46.0)
Hemoglobin: 9 g/dL — ABNORMAL LOW (ref 12.0–15.0)
MCH: 21.3 pg — ABNORMAL LOW (ref 26.0–34.0)
MCHC: 28 g/dL — ABNORMAL LOW (ref 30.0–36.0)
MCV: 76.1 fL — ABNORMAL LOW (ref 80.0–100.0)
Platelets: 82 10*3/uL — ABNORMAL LOW (ref 150–400)
RBC: 4.23 MIL/uL (ref 3.87–5.11)
RDW: 18.3 % — ABNORMAL HIGH (ref 11.5–15.5)
WBC: 4.3 10*3/uL (ref 4.0–10.5)
nRBC: 0 % (ref 0.0–0.2)

## 2023-01-11 LAB — BASIC METABOLIC PANEL
Anion gap: 8 (ref 5–15)
BUN: 21 mg/dL (ref 8–23)
CO2: 21 mmol/L — ABNORMAL LOW (ref 22–32)
Calcium: 9.7 mg/dL (ref 8.9–10.3)
Chloride: 110 mmol/L (ref 98–111)
Creatinine, Ser: 0.71 mg/dL (ref 0.44–1.00)
GFR, Estimated: >60 mL/min (ref >60)
Glucose, Bld: 199 mg/dL — ABNORMAL HIGH (ref 70–99)
Potassium: 4.2 mmol/L (ref 3.5–5.1)
Sodium: 139 mmol/L (ref 135–145)

## 2023-01-11 MED ORDER — SODIUM CHLORIDE 0.9 % IV SOLN
1.0000 g | Freq: Once | INTRAVENOUS | Status: AC
  Administered 2023-01-11: 1 g via INTRAVENOUS
  Filled 2023-01-11: qty 10

## 2023-01-11 MED ORDER — IOHEXOL 300 MG/ML  SOLN
100.0000 mL | Freq: Once | INTRAMUSCULAR | Status: AC | PRN
  Administered 2023-01-11: 100 mL via INTRAVENOUS

## 2023-01-11 MED ORDER — SODIUM CHLORIDE 0.9 % IV BOLUS
1000.0000 mL | Freq: Once | INTRAVENOUS | Status: AC
  Administered 2023-01-11: 1000 mL via INTRAVENOUS

## 2023-01-11 MED ORDER — CEPHALEXIN 500 MG PO CAPS: 500.0000 mg | ORAL_CAPSULE | Freq: Four times a day (QID) | ORAL | 0 refills | Status: DC

## 2023-01-11 NOTE — ED Triage Notes (Signed)
Pt to ED via POV from home. Pt reports was dx with a UTI and placed on multiple antibiotics. Pt reports pain is getting worse with urination and reports she has hx of cystocele and thinks her UTI is worse or her bladder has dropped again.

## 2023-01-11 NOTE — ED Provider Notes (Signed)
[facility] Provider Note  Patient Contact: 5:58 PM (approximate)   History   Urinary Retention   HPI  Monica Hess is a 82 y.o. female who presents emergency department with a sensation of incomplete bladder emptying/possible collapse of her bladder.  Patient states that she has a history of recurrent UTIs, scheduled to follow-up with urology for further evaluation.  Her last UTI was in March.  Will review patient's medical record it appears that she was seen at [facility] clinic and had a positive urine culture with Proteus.  Patient was treated with appropriate antibiotic at that time.  She states that symptoms went completely away and of return.  Patient also states that she has a history of either bladder collapse or what appears more likely a uterine prolapse.  Patient states that she has a sensation that there is "unknown" in her pelvis and she is unsure whether her symptoms may be a secondary to complication with the structure itself.  She is having frequency, dysuria.  No flank pain.  No fevers or chills.  No nausea or vomiting.  No other complaints at this time.     Physical Exam   Triage Vital Signs: ED Triage Vitals  Enc Vitals Group     BP 01/11/23 1525 (!) 184/60     Pulse Rate 01/11/23 1525 76     Resp 01/11/23 1525 18     Temp 01/11/23 1525 97.9 F (36.6 C)     Temp Source 01/11/23 1525 Oral     SpO2 01/11/23 1525 99 %     Weight --      Height --      Head Circumference --      Peak Flow --      Pain Score 01/11/23 1529 0     Pain Loc --      Pain Edu? --      Excl. in GC? --     Most recent vital signs: Vitals:   01/11/23 1525  BP: (!) 184/60  Pulse: 76  Resp: 18  Temp: 97.9 F (36.6 C)  SpO2: 99%     General: Alert and in no acute distress.  Cardiovascular:  Good peripheral perfusion Respiratory: Normal respiratory effort without tachypnea or retractions. Lungs CTAB. Good air entry to the bases with no decreased or absent  breath sounds. Gastrointestinal: Bowel sounds 4 quadrants. Soft and nontender to palpation. No guarding or rigidity. No palpable masses. No distention. No CVA tenderness. Musculoskeletal: Full range of motion to all extremities.  Neurologic:  No gross focal neurologic deficits are appreciated.  Skin:   No rash noted Other:   ED Results / Procedures / Treatments   Labs (all labs ordered are listed, but only abnormal results are displayed) Labs Reviewed  URINALYSIS, ROUTINE W REFLEX MICROSCOPIC - Abnormal; Notable for the following components:      Result Value   Color, Urine YELLOW (*)    APPearance HAZY (*)    Leukocytes,Ua SMALL (*)    Bacteria, UA RARE (*)    All other components within normal limits  BASIC METABOLIC PANEL - Abnormal; Notable for the following components:   CO2 21 (*)    Glucose, Bld 199 (*)    All other components within normal limits  CBC - Abnormal; Notable for the following components:   Hemoglobin 9.0 (*)    HCT 32.2 (*)    MCV 76.1 (*)    MCH 21.3 (*)    MCHC  28.0 (*)    RDW 18.3 (*)    Platelets 82 (*)    All other components within normal limits  URINE CULTURE  HEPATIC FUNCTION PANEL     EKG     RADIOLOGY  I personally viewed, evaluated, and interpreted these images as part of my medical decision making, as well as reviewing the written report by the radiologist.  ED Provider Interpretation: No acute findings on CT abdomen pelvis.  Incidental hernia, scoliosis  CT ABDOMEN PELVIS W CONTRAST  Result Date: 01/11/2023 CLINICAL DATA:  Urinary tract infection, recurrent/complicated. Increasing pain with urination. None EXAM: CT ABDOMEN AND PELVIS WITH CONTRAST TECHNIQUE: Multidetector CT imaging of the abdomen and pelvis was performed using the standard protocol following bolus administration of intravenous contrast. RADIATION DOSE REDUCTION: This exam was performed according to the departmental dose-optimization program which includes  automated exposure control, adjustment of the mA and/or kV according to patient size and/or use of iterative reconstruction technique. CONTRAST:  OMNIPAQUE IOHEXOL 300 MG/ML  SOLN COMPARISON:  Right upper quadrant ultrasound 07/02/2014 FINDINGS: Lower chest: Mild dependent atelectasis is present. The heart is mildly enlarged. No significant pleural or pericardial effusion is present. Hepatobiliary: No focal liver abnormality is seen. No gallstones, gallbladder wall thickening, or biliary dilatation. Pancreas: Unremarkable. No pancreatic ductal dilatation or surrounding inflammatory changes. Spleen: Spleen is somewhat enlarged measuring 14 cm cephalo caudad. No discrete lesions are present. Adrenals/Urinary Tract: Adrenal glands are normal bilaterally. Central sinus cysts are present in both kidneys. No obstruction is present. The ureters are within normal limits. The urinary bladder is unremarkable. Stomach/Bowel: A small hiatal hernia is present. The stomach and duodenum are otherwise within normal limits. Small bowel is unremarkable. Terminal ileum is within normal limits. The appendix is surgically absent. The ascending and transverse colon are within normal limits. The descending and sigmoid colon are normal. Vascular/Lymphatic: Atherosclerotic calcifications are present at the aorta and great vessels without aneurysm. No significant adenopathy is present. Reproductive: Status post hysterectomy. No adnexal masses. Other: No abdominal wall hernia or abnormality. No abdominopelvic ascites. Musculoskeletal: Mild leftward curvature is present in the upper lumbar spine. No significant listhesis is present. Lumbar lordosis is preserved. No focal osseous lesions are present. IMPRESSION: 1. No acute or focal lesion to explain the patient's symptoms. 2. Mild splenomegaly. 3. Small hiatal hernia. 4. Mild leftward curvature of the upper lumbar spine. 5.  Aortic Atherosclerosis (ICD10-I70.0). Electronically Signed    By: Marin Roberts M.D.   On: 01/11/2023 17:28    PROCEDURES:  Critical Care performed: No  Procedures   MEDICATIONS ORDERED IN ED: Medications  sodium chloride 0.9 % bolus 1,000 mL (1,000 mLs Intravenous New Bag/Given 01/11/23 1654)  cefTRIAXone (ROCEPHIN) 1 g in sodium chloride 0.9 % 100 mL IVPB (1 g Intravenous New Bag/Given 01/11/23 1653)  iohexol (OMNIPAQUE) 300 MG/ML solution 100 mL (100 mLs Intravenous Contrast Given 01/11/23 1704)     IMPRESSION / MDM / ASSESSMENT AND PLAN / ED COURSE  I reviewed the triage vital signs and the nursing notes.                                 Differential diagnosis includes, but is not limited to, UTI, pyelonephritis, nephrolithiasis, uterine prolapse, interstitial cystitis  Patient's presentation is most consistent with acute presentation with potential threat to life or bodily function.   Patient's diagnosis is consistent with UTI.  Patient presents emergency department dysuria,  polyuria.  Patient has had a history of recurring UTIs, scheduled in 3 weeks to see urology to determine why she keeps having repeat UTIs.  There is no flank pain.  No GI symptoms.  Patient came with burning and frequency.  At this time results are reassuring.  Given the repetitive symptoms, CT was obtained to ensure no evidence of pyelonephritis, bladder wall lesion, other intra-abdominal findings to replicate patient's symptoms.  CT is reassuring.  Labs are overall reassuring.  Urinalysis with findings consistent with UTI.  Rocephin given IV.  Cephalexin will be prescribed outpatient.  Urine culture sent at this time.  Follow-up with urology.  Return precautions discussed with the patient. Patient is given ED precautions to return to the ED for any worsening or new symptoms.     FINAL CLINICAL IMPRESSION(S) / ED DIAGNOSES   Final diagnoses:  Lower urinary tract infectious disease     Rx / DC Orders   ED Discharge Orders          Ordered    cephALEXin  (KEFLEX) 500 MG capsule  4 times daily        01/11/23 1839             Note:  This document was prepared using Dragon voice recognition software and may include unintentional dictation errors.   Lanette Hampshire 01/11/23 1840    Chesley Noon, MD 01/11/23 1930

## 2023-01-12 LAB — URINE CULTURE: Culture: >=100000 — AB

## 2023-01-13 LAB — URINE CULTURE

## 2023-01-30 ENCOUNTER — Encounter: Payer: Self-pay | Admitting: Urology

## 2023-01-30 ENCOUNTER — Ambulatory Visit: Payer: Medicare PPO | Admitting: Urology

## 2023-01-30 VITALS — BP 147/70 | HR 69 | Ht 66.0 in | Wt 170.0 lb

## 2023-01-30 DIAGNOSIS — N390 Urinary tract infection, site not specified: Secondary | ICD-10-CM

## 2023-01-30 DIAGNOSIS — Z09 Encounter for follow-up examination after completed treatment for conditions other than malignant neoplasm: Secondary | ICD-10-CM | POA: Diagnosis not present

## 2023-01-30 DIAGNOSIS — Z87440 Personal history of urinary (tract) infections: Secondary | ICD-10-CM | POA: Diagnosis not present

## 2023-01-30 LAB — MICROSCOPIC EXAMINATION: Epithelial Cells (non renal): >10 /hpf — AB (ref 0–10)

## 2023-01-30 LAB — URINALYSIS, COMPLETE
Bilirubin, UA: NEGATIVE
Leukocytes,UA: NEGATIVE
Nitrite, UA: NEGATIVE
RBC, UA: NEGATIVE
Specific Gravity, UA: 1.025 (ref 1.005–1.030)
Urobilinogen, Ur: 0.2 mg/dL (ref 0.2–1.0)
pH, UA: 5.5 (ref 5.0–7.5)

## 2023-01-30 LAB — BLADDER SCAN AMB NON-IMAGING: Scan Result: 1

## 2023-01-30 MED ORDER — TRIMETHOPRIM 100 MG PO TABS: 100.0000 mg | ORAL_TABLET | Freq: Every day | ORAL | 2 refills | Status: DC

## 2023-01-30 NOTE — Progress Notes (Signed)
I, Duke Salvia, acting as a Neurosurgeon for Riki Altes, MD., have documented all relevant documentation on the behalf of Riki Altes, MD, as directed by  Riki Altes, MD while in the presence of Riki Altes, MD.   01/30/2023 3:17 PM   Monica Hess 13-Jan-1941 [id number]  Referring provider: Gracelyn Nurse, MD [address] [place],  [place] [address]  Chief Complaint  Patient presents with   Recurrent UTI    HPI: Monica Hess is a 82 y.o. female referred for evaluation of recurrent UTIs.  Chart review remarkable fo a positive urine culture for proteus mirabilis 12/09/2022 and an ED visit 01/11/2023 asymptomatic UTI with urine culture positive for enterococcus. States she has been treated for 5 UTIs in the last 3-4 months.  Typical symptoms are urgency with urgent incontinence, dysuria, and dribbling. Denies gross hematuria, fever, or chills. Symptoms resolve after treatment. She did have a CT abdomen/pelvis at her ED visit, which showed no GU abnormalities.   PMH: Past Medical History:  Diagnosis Date   Back pain    Depression    Diabetes mellitus without complication (HCC)    GERD (gastroesophageal reflux disease)    Hypercholesterolemia    Hypertension     Surgical History: Past Surgical History:  Procedure Laterality Date   APPENDECTOMY     COLONOSCOPY     COLONOSCOPY WITH PROPOFOL N/A 07/25/2015   Procedure: COLONOSCOPY WITH PROPOFOL;  Surgeon: Scot Jun, MD;  Location: [facility];  Service: Endoscopy;  Laterality: N/A;   ESOPHAGOGASTRODUODENOSCOPY     ESOPHAGOGASTRODUODENOSCOPY (EGD) WITH PROPOFOL N/A 07/25/2015   Procedure: ESOPHAGOGASTRODUODENOSCOPY (EGD) WITH PROPOFOL;  Surgeon: Scot Jun, MD;  Location: [facility] END[facility]COPY;  Service: Endoscopy;  Laterality: N/A;   EYE SURGERY      Home Medications:  Allergies as of 01/30/2023       Reactions   Codeine Sulfate    Lipitor [atorvastatin]    Nitrofurantoin    Vytorin  [ezetimibe-simvastatin]         Medication List        Accurate as of Jan 30, 2023  3:17 PM. If you have any questions, ask your nurse or doctor.          Azor 5-40 MG tablet Generic drug: amLODipine-olmesartan Take 1 tablet by mouth daily.   cephALEXin 500 MG capsule Commonly known as: KEFLEX Take 1 capsule (500 mg total) by mouth 4 (four) times daily.   cholestyramine 4 g packet Commonly known as: QUESTRAN Take 4 g by mouth 3 (three) times daily with meals.   estrogens (conjugated) 0.3 MG tablet Commonly known as: PREMARIN Take 0.3 mg by mouth daily. Take daily for 21 days then do not take for 7 days.   fluticasone 50 MCG/ACT nasal spray Commonly known as: FLONASE Place into both nostrils daily.   glipiZIDE 5 MG tablet Commonly known as: GLUCOTROL Take by mouth daily before breakfast.   metFORMIN 500 MG tablet Commonly known as: GLUCOPHAGE Take by mouth 2 (two) times daily with a meal.   omeprazole 20 MG capsule Commonly known as: PRILOSEC Take 20 mg by mouth daily.   trimethoprim 100 MG tablet Commonly known as: TRIMPEX Take 1 tablet (100 mg total) by mouth daily. Started by: Riki Altes, MD        Allergies:  Allergies  Allergen Reactions   Codeine Sulfate    Lipitor [Atorvastatin]    Nitrofurantoin    Vytorin [Ezetimibe-Simvastatin]  Family History: Family History  Problem Relation Age of Onset   Breast cancer Neg Hx     Social History:  reports that she has quit smoking. She does not have any smokeless tobacco history on file. She reports that she does not currently use alcohol. No history on file for drug use.   Physical Exam: BP (!) 147/70   Pulse 69   Ht 5\' 6"  (1.676 m)   Wt 170 lb (77.1 kg)   BMI 27.44 kg/m   Constitutional:  Alert and oriented, No acute distress. HEENT: [place] AT, moist mucus membranes.  Trachea midline, no masses. Cardiovascular: No clubbing, cyanosis, or edema. Respiratory: Normal respiratory effort,  no increased work of breathing. GI: Abdomen is soft, nontender, nondistended, no abdominal masses Skin: No rashes, bruises or suspicious lesions. Neurologic: Grossly intact, no focal deficits, moving all 4 extremities. Psychiatric: Normal mood and affect.   Laboratory Data:  Urinalysis Dipstick trace glucose/trace ketones/1+protein. Microscopy >10 epies.   Assessment & Plan:    Recurrent UTI She has had 2 documented infections within 6 months, meeting the AUA criteria of recurrent UTI. She does take oral primarin. Discussed supplements for UTI prevention including D-mannose and cranberry. 3 month trial Trimethoprim 100 mg daily. PA follow up in 3 months. Instructed to call earlier for recurrent UTI symptoms.      [facility] [address] [place], [place] [address] [phone]

## 2023-05-01 NOTE — Progress Notes (Deleted)
05/02/2023 4:23 PM   Monica Hess June 23, 1941 [id number]  Referring provider: Gracelyn Nurse, MD [address] [place],  [place] [address]  Urological history: 1. rUTI's -Contributing factors of age, GSM and diabetes -CT (12/2022) - no nidus for infections -Documented urine cultures over the last year  01/11/2023 - Enterococcus faecalis  12/09/2022- Proteus mirabilis  -Trimethoprim 100 mg daily  No chief complaint on file.  HPI: Monica Hess is a 82 y.o. female who presents today for 57-month follow-up after trial of trimethoprim 100 mg daily for recurrent UTIs.    Previous records reviewed.   PMH: Past Medical History:  Diagnosis Date   Back pain    Depression    Diabetes mellitus without complication (HCC)    GERD (gastroesophageal reflux disease)    Hypercholesterolemia    Hypertension     Surgical History: Past Surgical History:  Procedure Laterality Date   APPENDECTOMY     COLONOSCOPY     COLONOSCOPY WITH PROPOFOL N/A 07/25/2015   Procedure: COLONOSCOPY WITH PROPOFOL;  Surgeon: Scot Jun, MD;  Location: [facility] ENDOSCOPY;  Service: Endoscopy;  Laterality: N/A;   ESOPHAGOGASTRODUODENOSCOPY     ESOPHAGOGASTRODUODENOSCOPY (EGD) WITH PROPOFOL N/A 07/25/2015   Procedure: ESOPHAGOGASTRODUODENOSCOPY (EGD) WITH PROPOFOL;  Surgeon: Scot Jun, MD;  Location: [facility] ENDOSCOPY;  Service: Endoscopy;  Laterality: N/A;   EYE SURGERY      Home Medications:  Allergies as of 05/02/2023       Reactions   Codeine Sulfate    Lipitor [atorvastatin]    Nitrofurantoin    Vytorin [ezetimibe-simvastatin]         Medication List        Accurate as of May 01, 2023  4:23 PM. If you have any questions, ask your nurse or doctor.          Azor 5-40 MG tablet Generic drug: amLODipine-olmesartan Take 1 tablet by mouth daily.   cephALEXin 500 MG capsule Commonly known as: KEFLEX Take 1 capsule (500 mg total) by mouth 4 (four) times daily.    cholestyramine 4 g packet Commonly known as: QUESTRAN Take 4 g by mouth 3 (three) times daily with meals.   estrogens (conjugated) 0.3 MG tablet Commonly known as: PREMARIN Take 0.3 mg by mouth daily. Take daily for 21 days then do not take for 7 days.   fluticasone 50 MCG/ACT nasal spray Commonly known as: FLONASE Place into both nostrils daily.   glipiZIDE 5 MG tablet Commonly known as: GLUCOTROL Take by mouth daily before breakfast.   metFORMIN 500 MG tablet Commonly known as: GLUCOPHAGE Take by mouth 2 (two) times daily with a meal.   omeprazole 20 MG capsule Commonly known as: PRILOSEC Take 20 mg by mouth daily.   trimethoprim 100 MG tablet Commonly known as: TRIMPEX Take 1 tablet (100 mg total) by mouth daily.        Allergies:  Allergies  Allergen Reactions   Codeine Sulfate    Lipitor [Atorvastatin]    Nitrofurantoin    Vytorin [Ezetimibe-Simvastatin]     Family History: Family History  Problem Relation Age of Onset   Breast cancer Neg Hx     Social History:  reports that she has quit smoking. She does not have any smokeless tobacco history on file. She reports that she does not currently use alcohol. No history on file for drug use.  ROS: Pertinent ROS in HPI  Physical Exam: There were no vitals taken for this visit.  Constitutional:  Well nourished. Alert and oriented, No acute distress. HEENT: [place] AT, moist mucus membranes.  Trachea midline, no masses. Cardiovascular: No clubbing, cyanosis, or edema. Respiratory: Normal respiratory effort, no increased work of breathing. Neurologic: Grossly intact, no focal deficits, moving all 4 extremities. Psychiatric: Normal mood and affect.    Laboratory Data: ontains abnormal data Basic Metabolic Panel (BMP) Order: [id number] Component Ref Range & Units 2 mo ago  Glucose 70 - 110 mg/dL 782 High   Sodium 956 - 145 mmol/L 138  Potassium 3.6 - 5.1 mmol/L 5.0  Chloride 97 - 109 mmol/L 109  Carbon  Dioxide (CO2) 22.0 - 32.0 mmol/L 22.1  Calcium 8.7 - 10.3 mg/dL 21.3  Urea Nitrogen (BUN) 7 - 25 mg/dL 28 High   Creatinine 0.6 - 1.1 mg/dL 1.1  Glomerular Filtration Rate (eGFR) >60 mL/min/1.73sq m 51 Low   Comment: CKD-EPI (2021) does not include patient's race in the calculation of eGFR.  Monitoring changes of plasma creatinine and eGFR over time is useful for monitoring kidney function.  Interpretive Ranges for eGFR (CKD-EPI 2021):  eGFR:       >60 mL/min/1.73 sq. m - Normal eGFR:       30-59 mL/min/1.73 sq. m - Moderately Decreased eGFR:       15-29 mL/min/1.73 sq. m  - Severely Decreased eGFR:       < 15 mL/min/1.73 sq. m  - Kidney Failure   Note: These eGFR calculations do not apply in acute situations when eGFR is changing rapidly or patients on dialysis.  BUN/Crea Ratio 6.0 - 20.0 25.5 High   Anion Gap w/K 6.0 - 16.0 11.9  Resulting Agency [facility] - LAB   Specimen Collected: 02/06/23 07:38   Performed by: [facility] - [facility] Last Resulted: 02/06/23 12:19  Received From: [facility]  Result Received: 03/12/23 11:43    Hemoglobin A1C Order: [id number] Component Ref Range & Units 2 mo ago  Hemoglobin A1C 4.2 - 5.6 % 7.5 High   Average Blood Glucose (Calc) mg/dL 629  Resulting Agency [facility] - LAB  Narrative Performed by [facility] - LAB Normal Range:    4.2 - 5.6% Increased Risk:  5.7 - 6.4% Diabetes:        >= 6.5% Glycemic Control for adults with diabetes:  <7%    Specimen Collected: 02/06/23 07:38   Performed by: [facility] - [facility] Last Resulted: 02/06/23 10:10  Received From: [facility]  Result Received: 03/12/23 11:43  I have reviewed the labs.   Pertinent Imaging: N/A  Assessment & Plan:  ***  1. rUTI's  - criteria for recurrent UTI has been met with 2 or more infections in 6 months or 3 or greater infections in one year ***  - patient is instructed to  increase their water intake until the urine is pale yellow or clear (10 to 12 cups daily) ***  - patient is instructed to take probiotics (yogurt, oral pills or vaginal suppositories), take cranberry pills or drink the juice and Vitamin C 1,000 mg daily to acidify the urine ***  - if using tampons, she should remove them prior to urinating and change them often ***  - avoid soaking in tubs and wipe front to back after urinating ***  - benefit from core strengthening exercises has been seen.  We can refer to PT if they desire ***  - advised them to have CATH UA's for urinalysis and  culture to prevent skin, vaginal and/or rectal contamination of the specimen  - reviewed symptoms of UTI (fevers, chills, gross hematuria, mental status changes, dysuria, suprapubic pain, back pain and/or sudden worsening of urinary symptoms) and advised not to have urine checked or be treated for UTI if not experiencing symptoms  - discussed antibiotic stewardship with the patient - explained the risk of increasing risk of antibiotic resistance with continuous exposure to antibiotics, renal failure, hypoglycemia, C. Diff infection, allergic reactions, etc.                                                  No follow-ups on file.  These notes generated with voice recognition software. I apologize for typographical errors.  [name]  [facility] [address]  [address] [place], [place] [address] [phone]

## 2023-05-02 ENCOUNTER — Ambulatory Visit: Payer: Medicare PPO | Admitting: Urology

## 2023-05-02 DIAGNOSIS — N390 Urinary tract infection, site not specified: Secondary | ICD-10-CM

## 2023-05-03 IMAGING — MG MM DIGITAL DIAGNOSTIC UNILAT*R* W/ TOMO W/ CAD
4 series · 4 of 12 positions shown · non-contrast
Comparison: Previous exam(s).

CLINICAL DATA: Callback for RIGHT breast asymmetry

EXAM:
DIGITAL DIAGNOSTIC UNILATERAL RIGHT MAMMOGRAM WITH TOMOSYNTHESIS AND
CAD; ULTRASOUND RIGHT BREAST LIMITED
TECHNIQUE: Right digital diagnostic mammography and breast tomosynthesis was
performed. The images were evaluated with computer-aided detection.;
Targeted ultrasound examination of the right breast was performed

[R MLO synth-2D]
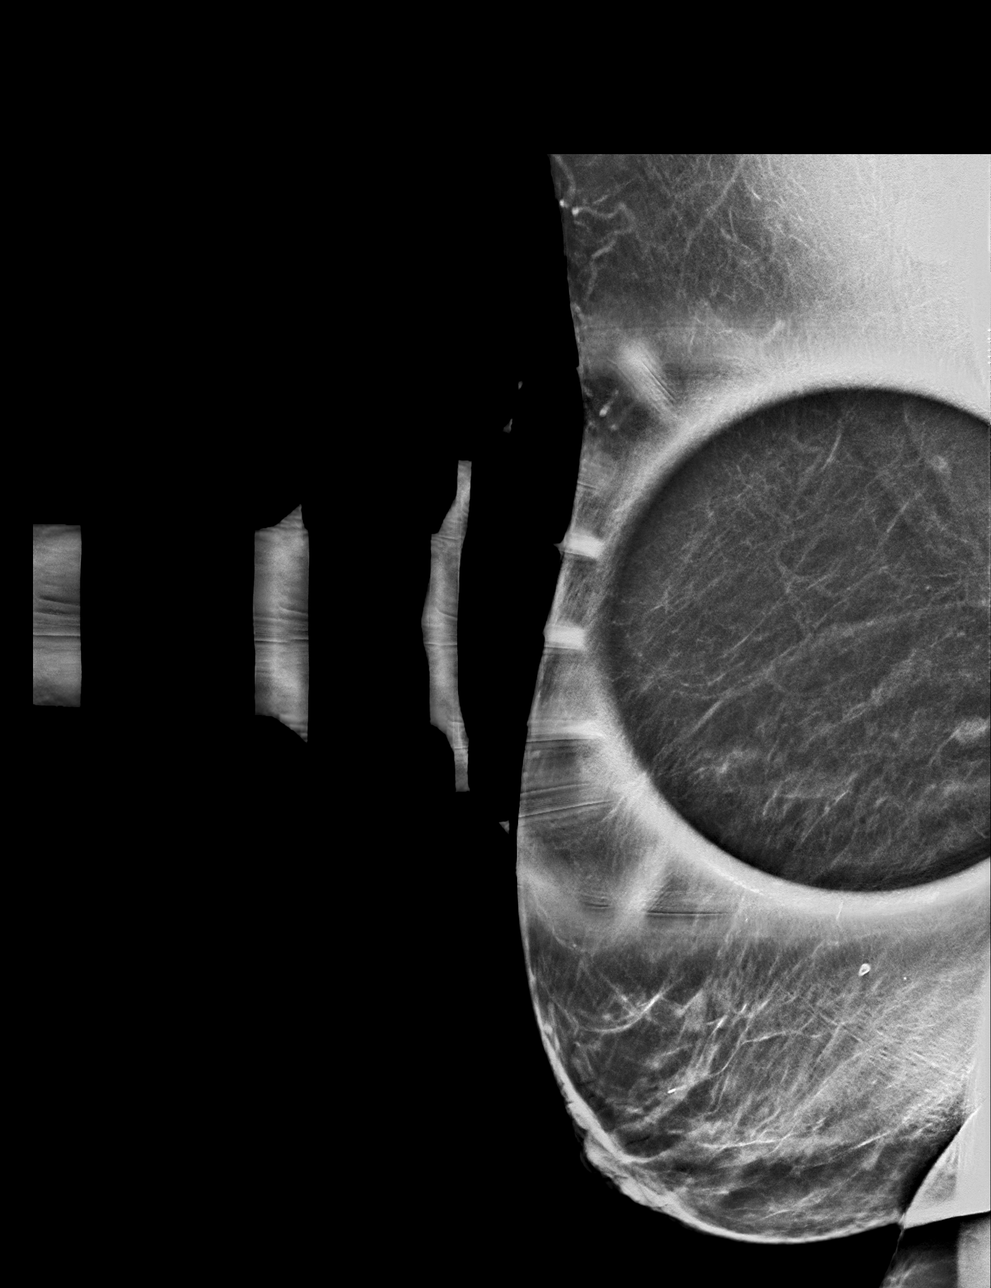

[R LM synth-2D]
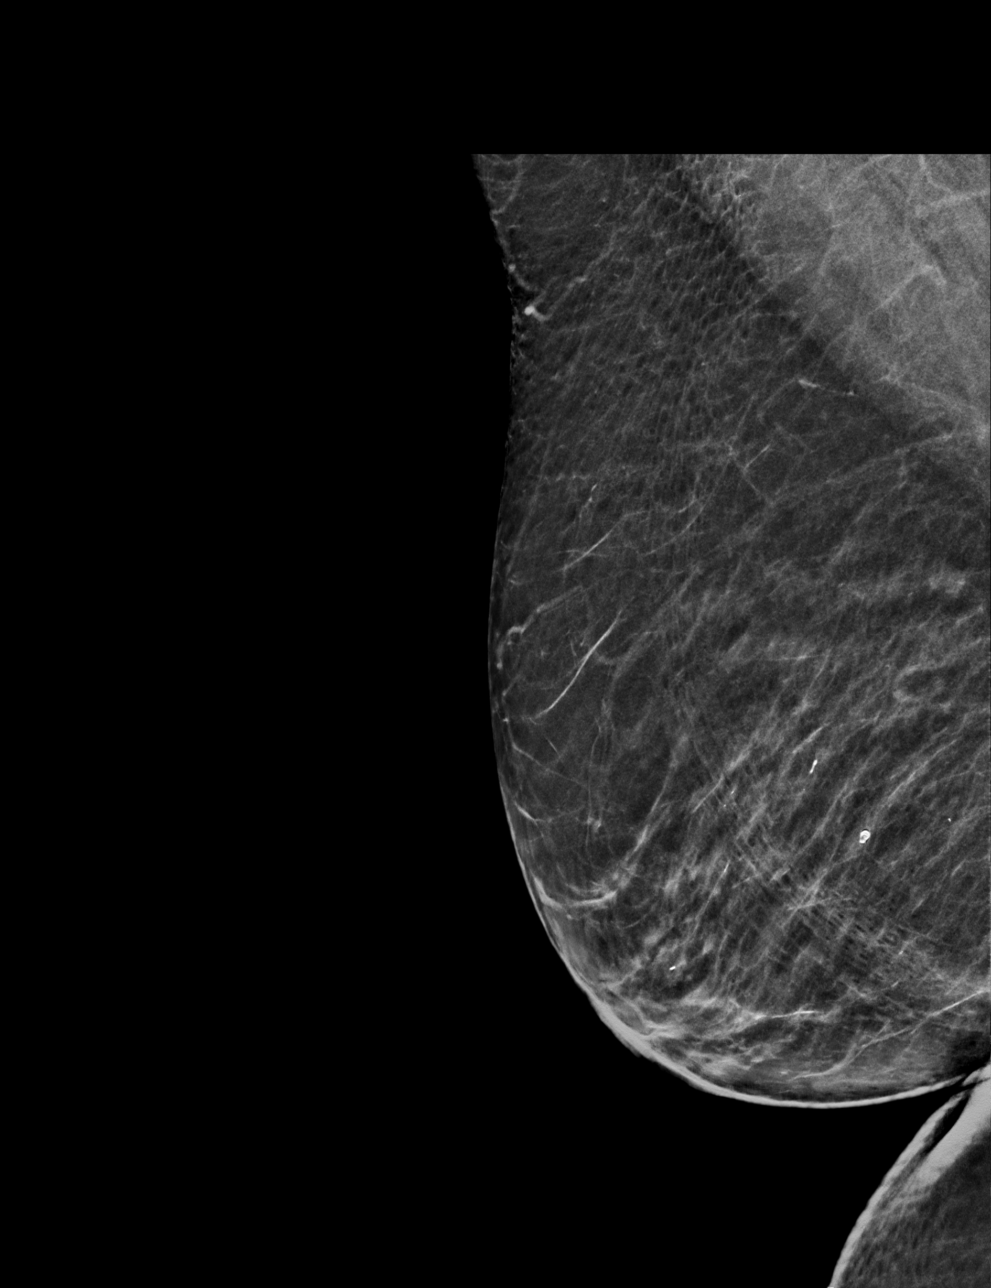

[R LM tomo · tomo slice 27/54.0]
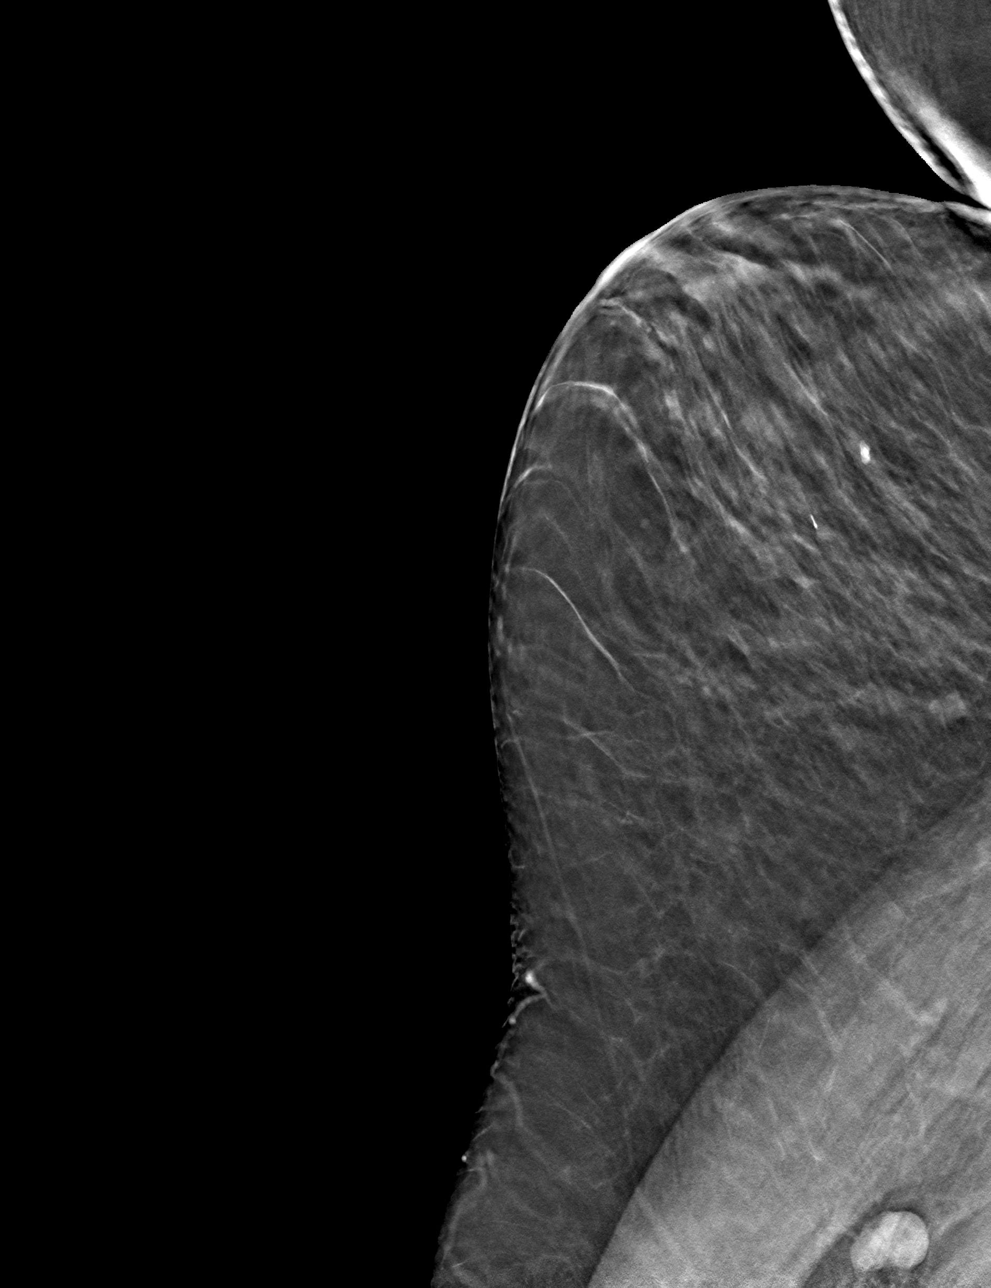

[R MLO tomo · tomo slice 27/54.0]
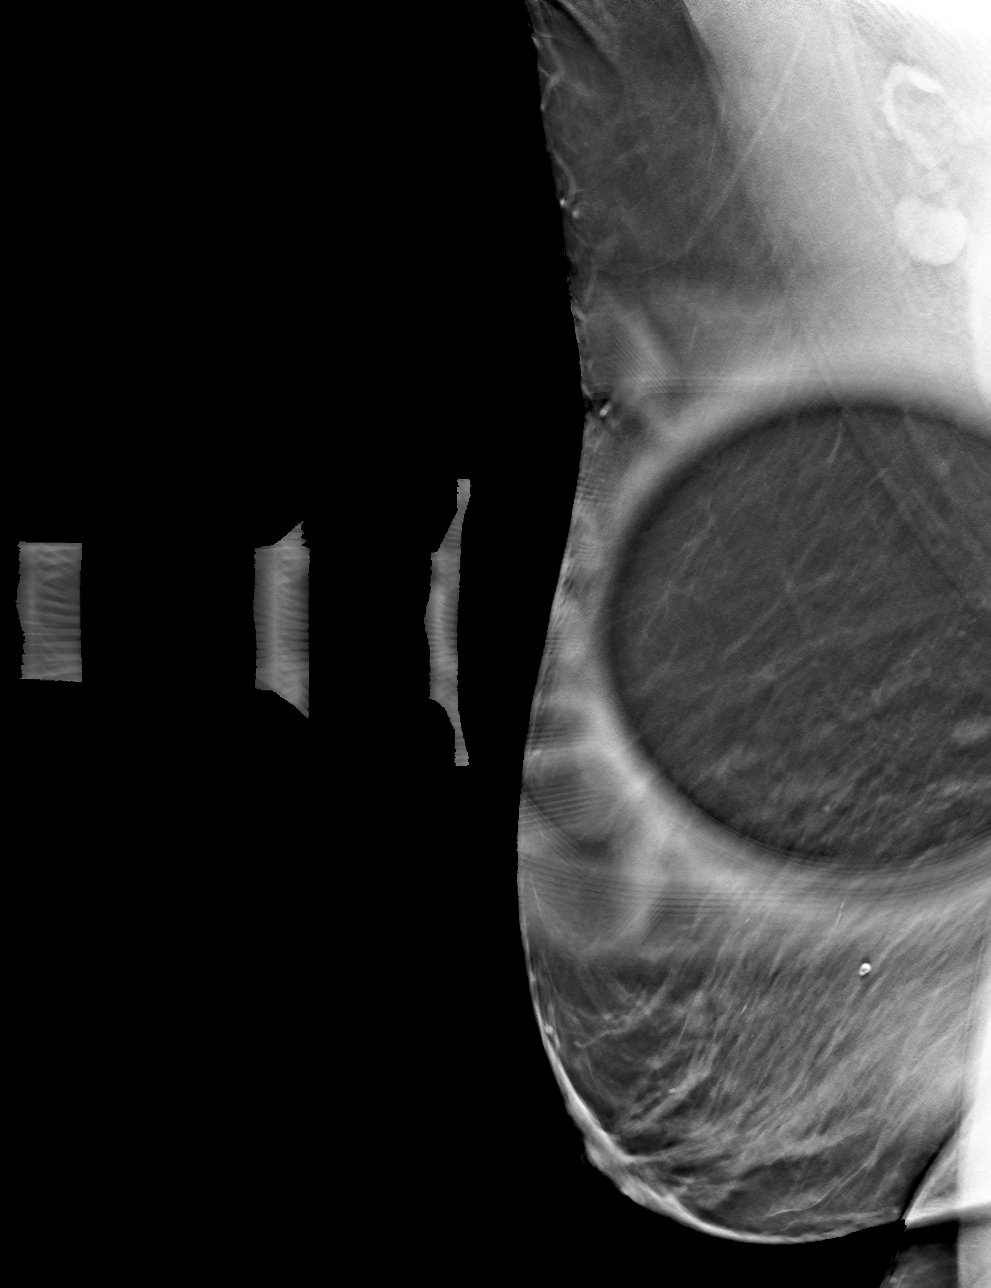

[4 of 12 positions shown; findings below may reference images not displayed]

ACR Breast Density Category b: There are scattered areas of
fibroglandular density.
FINDINGS: Spot compression tomosynthesis views confirm persistence of an oval
circumscribed mass in the RIGHT outer breast at posterior depth. It
is best seen on spot MLO slice 37 and measures approximately 5 mm.

On physical exam, no suspicious mass is appreciated.

Targeted ultrasound was performed of the RIGHT outer breast. At 9
o'clock 6 cm from the nipple, there is an oval circumscribed
anechoic mass with posterior acoustic enhancement. It measures 4 x 3
x 5 mm and is consistent with a benign simple cyst. This corresponds
to the site of screening mammographic concern.
IMPRESSION: There is a benign cyst at the site of screening mammographic
concern.

RECOMMENDATION:
Screening mammogram in one year.(Code:Z5-D-Z1E)

I have discussed the findings and recommendations with the patient.
If applicable, a reminder letter will be sent to the patient
regarding the next appointment.

BI-RADS CATEGORY  2: Benign.

## 2023-05-03 IMAGING — US US BREAST*R* LIMITED INC AXILLA
1 series · 5 of 5 positions shown · non-contrast
Comparison: Previous exam(s).

CLINICAL DATA: Callback for RIGHT breast asymmetry

EXAM:
DIGITAL DIAGNOSTIC UNILATERAL RIGHT MAMMOGRAM WITH TOMOSYNTHESIS AND
CAD; ULTRASOUND RIGHT BREAST LIMITED
TECHNIQUE: Right digital diagnostic mammography and breast tomosynthesis was
performed. The images were evaluated with computer-aided detection.;
Targeted ultrasound examination of the right breast was performed

[Series 1: us breast*right* limited inc axilla · 0.06mm/px · 5 of 5 slices shown]
[im 1/5]
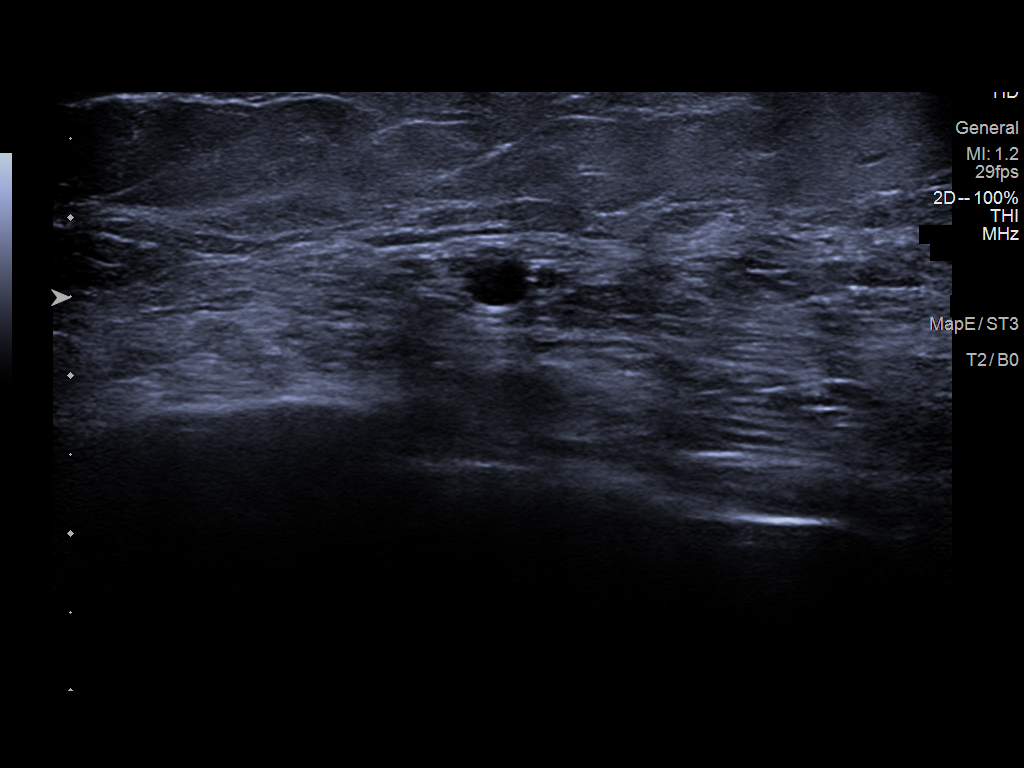
[im 2/5]
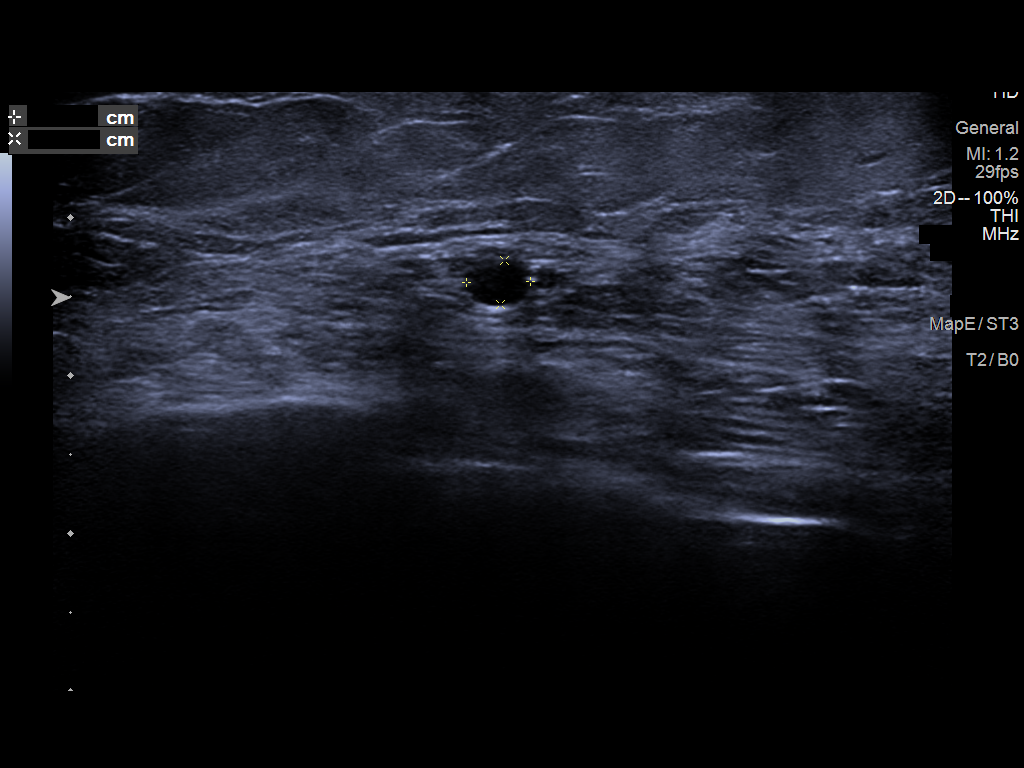
[im 3/5]
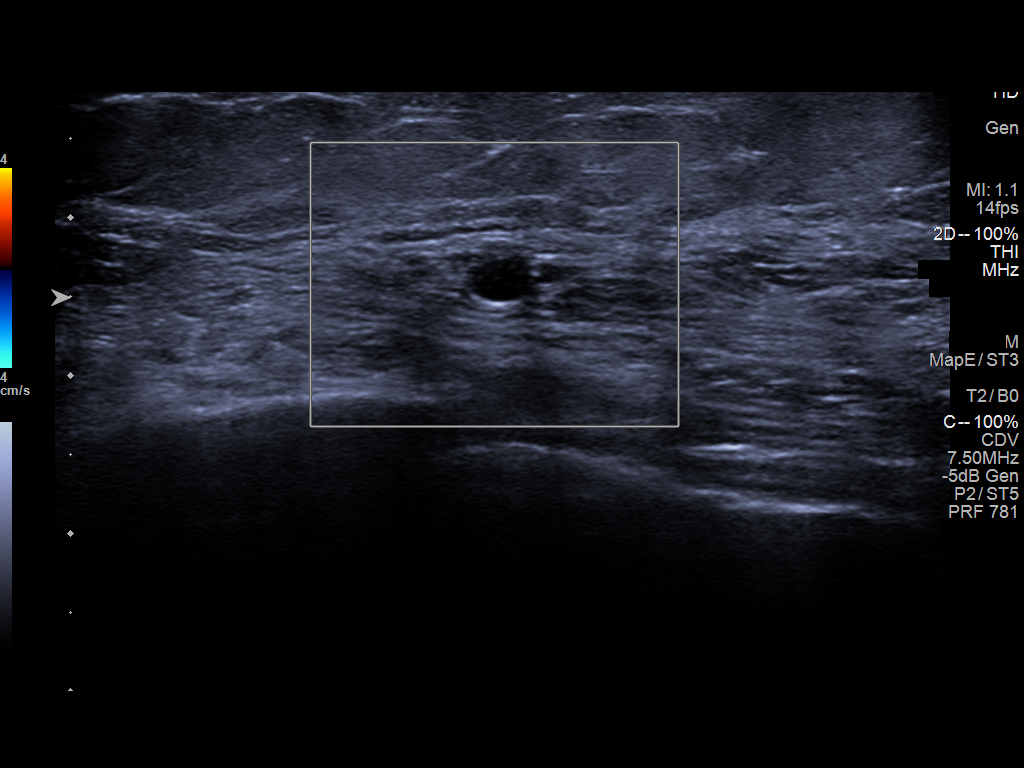
[im 4/5]
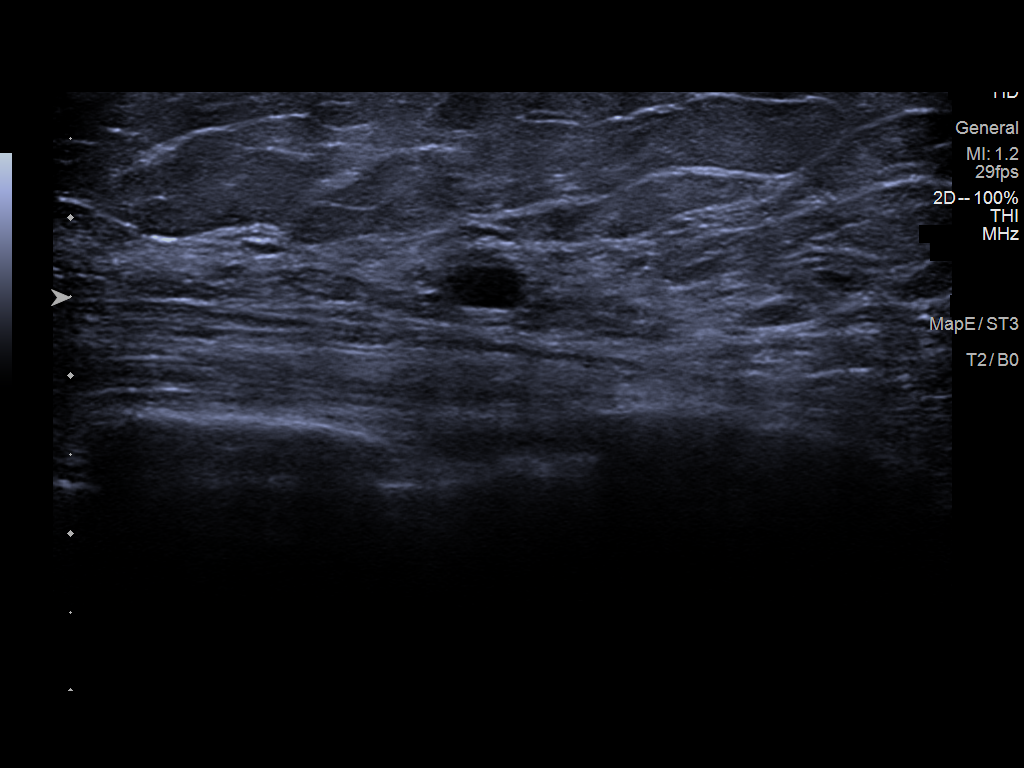
[im 5/5]
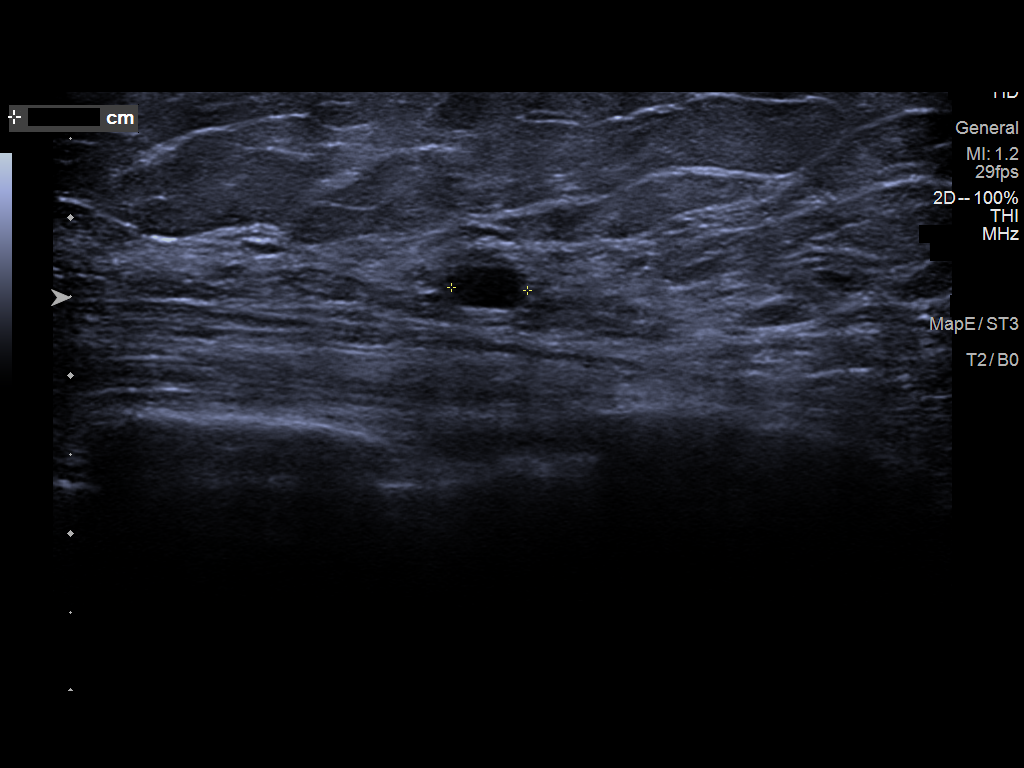

[5 of 5 positions shown; findings below may reference images not displayed]

ACR Breast Density Category b: There are scattered areas of
fibroglandular density.
FINDINGS: Spot compression tomosynthesis views confirm persistence of an oval
circumscribed mass in the RIGHT outer breast at posterior depth. It
is best seen on spot MLO slice 37 and measures approximately 5 mm.

On physical exam, no suspicious mass is appreciated.

Targeted ultrasound was performed of the RIGHT outer breast. At 9
o'clock 6 cm from the nipple, there is an oval circumscribed
anechoic mass with posterior acoustic enhancement. It measures 4 x 3
x 5 mm and is consistent with a benign simple cyst. This corresponds
to the site of screening mammographic concern.
IMPRESSION: There is a benign cyst at the site of screening mammographic
concern.

RECOMMENDATION:
Screening mammogram in one year.(Code:Z5-D-Z1E)

I have discussed the findings and recommendations with the patient.
If applicable, a reminder letter will be sent to the patient
regarding the next appointment.

BI-RADS CATEGORY  2: Benign.

## 2023-05-07 NOTE — Progress Notes (Unsigned)
05/08/2023 9:33 AM   Monica Hess [birth year] [id number]  Referring provider: Gracelyn Nurse, MD [address] [place],  [place] [address]  Urological history: 1. rUTI's -Contributing factors of age, GSM and diabetes -CT (12/2022) - no nidus for infections -Documented urine cultures over the last year  01/11/2023 - Enterococcus faecalis  12/09/2022- Proteus mirabilis  -Trimethoprim 100 mg daily  No chief complaint on file.  HPI: Monica Hess is a 82 y.o. female who presents today for 79-month follow-up after trial of trimethoprim 100 mg daily for recurrent UTIs.    Previous records reviewed.   PMH: Past Medical History:  Diagnosis Date   Back pain    Depression    Diabetes mellitus without complication (HCC)    GERD (gastroesophageal reflux disease)    Hypercholesterolemia    Hypertension     Surgical History: Past Surgical History:  Procedure Laterality Date   APPENDECTOMY     COLONOSCOPY     COLONOSCOPY WITH PROPOFOL N/A 07/25/2015   Procedure: COLONOSCOPY WITH PROPOFOL;  Surgeon: Scot Jun, MD;  Location: [facility] ENDOSCOPY;  Service: Endoscopy;  Laterality: N/A;   ESOPHAGOGASTRODUODENOSCOPY     ESOPHAGOGASTRODUODENOSCOPY (EGD) WITH PROPOFOL N/A 07/25/2015   Procedure: ESOPHAGOGASTRODUODENOSCOPY (EGD) WITH PROPOFOL;  Surgeon: Scot Jun, MD;  Location: [facility] ENDOSCOPY;  Service: Endoscopy;  Laterality: N/A;   EYE SURGERY      Home Medications:  Allergies as of 05/08/2023       Reactions   Codeine Sulfate    Lipitor [atorvastatin]    Nitrofurantoin    Vytorin [ezetimibe-simvastatin]         Medication List        Accurate as of May 07, 2023  9:33 AM. If you have any questions, ask your nurse or doctor.          Azor 5-40 MG tablet Generic drug: amLODipine-olmesartan Take 1 tablet by mouth daily.   cephALEXin 500 MG capsule Commonly known as: KEFLEX Take 1 capsule (500 mg total) by mouth 4 (four) times daily.    cholestyramine 4 g packet Commonly known as: QUESTRAN Take 4 g by mouth 3 (three) times daily with meals.   estrogens (conjugated) 0.3 MG tablet Commonly known as: PREMARIN Take 0.3 mg by mouth daily. Take daily for 21 days then do not take for 7 days.   fluticasone 50 MCG/ACT nasal spray Commonly known as: FLONASE Place into both nostrils daily.   glipiZIDE 5 MG tablet Commonly known as: GLUCOTROL Take by mouth daily before breakfast.   metFORMIN 500 MG tablet Commonly known as: GLUCOPHAGE Take by mouth 2 (two) times daily with a meal.   omeprazole 20 MG capsule Commonly known as: PRILOSEC Take 20 mg by mouth daily.   trimethoprim 100 MG tablet Commonly known as: TRIMPEX Take 1 tablet (100 mg total) by mouth daily.        Allergies:  Allergies  Allergen Reactions   Codeine Sulfate    Lipitor [Atorvastatin]    Nitrofurantoin    Vytorin [Ezetimibe-Simvastatin]     Family History: Family History  Problem Relation Age of Onset   Breast cancer Neg Hx     Social History:  reports that she has quit smoking. She does not have any smokeless tobacco history on file. She reports that she does not currently use alcohol. No history on file for drug use.  ROS: Pertinent ROS in HPI  Physical Exam: There were no vitals taken for this visit.  Constitutional:  Well nourished. Alert and oriented, No acute distress. HEENT: [place] AT, moist mucus membranes.  Trachea midline, no masses. Cardiovascular: No clubbing, cyanosis, or edema. Respiratory: Normal respiratory effort, no increased work of breathing. Neurologic: Grossly intact, no focal deficits, moving all 4 extremities. Psychiatric: Normal mood and affect.    Laboratory Data: ontains abnormal data Basic Metabolic Panel (BMP) Order: [id number] Component Ref Range & Units 2 mo ago  Glucose 70 - 110 mg/dL 782 High   Sodium 956 - 145 mmol/L 138  Potassium 3.6 - 5.1 mmol/L 5.0  Chloride 97 - 109 mmol/L 109  Carbon  Dioxide (CO2) 22.0 - 32.0 mmol/L 22.1  Calcium 8.7 - 10.3 mg/dL 21.3  Urea Nitrogen (BUN) 7 - 25 mg/dL 28 High   Creatinine 0.6 - 1.1 mg/dL 1.1  Glomerular Filtration Rate (eGFR) >60 mL/min/1.73sq m 51 Low   Comment: CKD-EPI (2021) does not include patient's race in the calculation of eGFR.  Monitoring changes of plasma creatinine and eGFR over time is useful for monitoring kidney function.  Interpretive Ranges for eGFR (CKD-EPI 2021):  eGFR:       >60 mL/min/1.73 sq. m - Normal eGFR:       30-59 mL/min/1.73 sq. m - Moderately Decreased eGFR:       15-29 mL/min/1.73 sq. m  - Severely Decreased eGFR:       < 15 mL/min/1.73 sq. m  - Kidney Failure   Note: These eGFR calculations do not apply in acute situations when eGFR is changing rapidly or patients on dialysis.  BUN/Crea Ratio 6.0 - 20.0 25.5 High   Anion Gap w/K 6.0 - 16.0 11.9  Resulting Agency [facility] - LAB   Specimen Collected: 02/06/23 07:38   Performed by: [facility] - [facility] Last Resulted: 02/06/23 12:19  Received From: [facility]  Result Received: 03/12/23 11:43    Hemoglobin A1C Order: [id number] Component Ref Range & Units 2 mo ago  Hemoglobin A1C 4.2 - 5.6 % 7.5 High   Average Blood Glucose (Calc) mg/dL 629  Resulting Agency [facility] - LAB  Narrative Performed by [facility] - [facility] Normal Range:    4.2 - 5.6% Increased Risk:  5.7 - 6.4% Diabetes:        >= 6.5% Glycemic Control for adults with diabetes:  <7%    Specimen Collected: 02/06/23 07:38   Performed by: [facility] - [facility] Last Resulted: 02/06/23 10:10  Received From: [facility]  Result Received: 03/12/23 11:43  I have reviewed the labs.   Pertinent Imaging: N/A  Assessment & Plan:  ***  1. rUTI's  - criteria for recurrent UTI has been met with 2 or more infections in 6 months or 3 or greater infections in one year ***  - patient is instructed to  increase their water intake until the urine is pale yellow or clear (10 to 12 cups daily) ***  - patient is instructed to take probiotics (yogurt, oral pills or vaginal suppositories), take cranberry pills or drink the juice and Vitamin C 1,000 mg daily to acidify the urine ***  - if using tampons, she should remove them prior to urinating and change them often ***  - avoid soaking in tubs and wipe front to back after urinating ***  - benefit from core strengthening exercises has been seen.  We can refer to PT if they desire ***  - advised them to have CATH UA's for urinalysis and  culture to prevent skin, vaginal and/or rectal contamination of the specimen  - reviewed symptoms of UTI (fevers, chills, gross hematuria, mental status changes, dysuria, suprapubic pain, back pain and/or sudden worsening of urinary symptoms) and advised not to have urine checked or be treated for UTI if not experiencing symptoms  - discussed antibiotic stewardship with the patient - explained the risk of increasing risk of antibiotic resistance with continuous exposure to antibiotics, renal failure, hypoglycemia, C. Diff infection, allergic reactions, etc.                                                  No follow-ups on file.  These notes generated with voice recognition software. I apologize for typographical errors.  [name]  [facility] [address]  [address] [place], [place] [address] [phone]

## 2023-05-08 ENCOUNTER — Encounter: Payer: Self-pay | Admitting: Urology

## 2023-05-08 ENCOUNTER — Ambulatory Visit (INDEPENDENT_AMBULATORY_CARE_PROVIDER_SITE_OTHER): Payer: Medicare PPO | Admitting: Urology

## 2023-05-08 VITALS — BP 144/63 | HR 66 | Ht 66.0 in | Wt 170.0 lb

## 2023-05-08 DIAGNOSIS — Z87440 Personal history of urinary (tract) infections: Secondary | ICD-10-CM

## 2023-05-08 DIAGNOSIS — N390 Urinary tract infection, site not specified: Secondary | ICD-10-CM

## 2023-05-08 DIAGNOSIS — N3941 Urge incontinence: Secondary | ICD-10-CM

## 2023-05-08 MED ORDER — GEMTESA 75 MG PO TABS
75.0000 mg | ORAL_TABLET | Freq: Every day | ORAL | Status: DC
Start: 2023-05-08 — End: 2023-09-10

## 2023-06-18 NOTE — Progress Notes (Unsigned)
06/19/2023 9:48 AM   Monica Hess 1941-04-18 [id number]  Referring provider: Gracelyn Nurse, MD [address] [place],  [place] [address]  Urological history: 1. rUTI's -Contributing factors of age, GSM and diabetes -CT (12/2022) - no nidus for infections -Documented urine cultures over the last year  01/11/2023 - Enterococcus faecalis  12/09/2022- Proteus mirabilis  -Trimethoprim 100 mg daily  No chief complaint on file.  HPI: Monica Hess is a 82 y.o. female who presents today for 6 week month follow-up after trial of Gemtesa for urge incontinence.  Previous records reviewed.   PVR ***   PMH: Past Medical History:  Diagnosis Date   Back pain    Depression    Diabetes mellitus without complication (HCC)    GERD (gastroesophageal reflux disease)    Hypercholesterolemia    Hypertension     Surgical History: Past Surgical History:  Procedure Laterality Date   APPENDECTOMY     COLONOSCOPY     COLONOSCOPY WITH PROPOFOL N/A 07/25/2015   Procedure: COLONOSCOPY WITH PROPOFOL;  Surgeon: Scot Jun, MD;  Location: [facility] ENDOSCOPY;  Service: Endoscopy;  Laterality: N/A;   ESOPHAGOGASTRODUODENOSCOPY     ESOPHAGOGASTRODUODENOSCOPY (EGD) WITH PROPOFOL N/A 07/25/2015   Procedure: ESOPHAGOGASTRODUODENOSCOPY (EGD) WITH PROPOFOL;  Surgeon: Scot Jun, MD;  Location: [facility] ENDOSCOPY;  Service: Endoscopy;  Laterality: N/A;   EYE SURGERY      Home Medications:  Allergies as of 06/19/2023       Reactions   Codeine Sulfate    Lipitor [atorvastatin]    Nitrofurantoin    Vytorin [ezetimibe-simvastatin]         Medication List        Accurate as of June 18, 2023  9:48 AM. If you have any questions, ask your nurse or doctor.          Azor 5-40 MG tablet Generic drug: amLODipine-olmesartan Take 1 tablet by mouth daily.   celecoxib 200 MG capsule Commonly known as: CELEBREX   cephALEXin 500 MG capsule Commonly known as: KEFLEX Take 1  capsule (500 mg total) by mouth 4 (four) times daily.   cholestyramine 4 g packet Commonly known as: QUESTRAN Take 4 g by mouth 3 (three) times daily with meals.   ciprofloxacin 250 MG tablet Commonly known as: CIPRO   estrogens (conjugated) 0.3 MG tablet Commonly known as: PREMARIN Take 0.3 mg by mouth daily. Take daily for 21 days then do not take for 7 days.   Fifty50 Glucose Meter 2.0 w/Device Kit Freestyle Meter. Use as directed to check blood glucose Dx E11.65   fluticasone 50 MCG/ACT nasal spray Commonly known as: FLONASE Place into both nostrils daily.   Gemtesa 75 MG Tabs Generic drug: Vibegron Take 1 tablet (75 mg total) by mouth daily.   glipiZIDE 5 MG tablet Commonly known as: GLUCOTROL Take by mouth daily before breakfast.   hydrALAZINE 25 MG tablet Commonly known as: APRESOLINE   metFORMIN 500 MG tablet Commonly known as: GLUCOPHAGE Take by mouth 2 (two) times daily with a meal.   omeprazole 20 MG capsule Commonly known as: PRILOSEC Take 20 mg by mouth daily.   pioglitazone 15 MG tablet Commonly known as: ACTOS Take 15 mg by mouth daily.   trimethoprim 100 MG tablet Commonly known as: TRIMPEX Take 1 tablet (100 mg total) by mouth daily.        Allergies:  Allergies  Allergen Reactions   Codeine Sulfate    Lipitor [Atorvastatin]  Nitrofurantoin    Vytorin [Ezetimibe-Simvastatin]     Family History: Family History  Problem Relation Age of Onset   Breast cancer Neg Hx     Social History:  reports that she has quit smoking. She does not have any smokeless tobacco history on file. She reports that she does not currently use alcohol. No history on file for drug use.  ROS: Pertinent ROS in HPI  Physical Exam: There were no vitals taken for this visit.  Constitutional:  Well nourished. Alert and oriented, No acute distress. HEENT: [place] AT, moist mucus membranes.  Trachea midline, no masses. Cardiovascular: No clubbing, cyanosis, or  edema. Respiratory: Normal respiratory effort, no increased work of breathing. GU: No CVA tenderness.  No bladder fullness or masses.  Recession of labia minora, dry, pale vulvar vaginal mucosa and loss of mucosal ridges and folds.  Normal urethral meatus, no lesions, no prolapse, no discharge.   No urethral masses, tenderness and/or tenderness. No bladder fullness, tenderness or masses. *** vagina mucosa, *** estrogen effect, no discharge, no lesions, *** pelvic support, *** cystocele and *** rectocele noted.  No cervical motion tenderness.  Uterus is freely mobile and non-fixed.  No adnexal/parametria masses or tenderness noted.  Anus and perineum are without rashes or lesions.   ***  Neurologic: Grossly intact, no focal deficits, moving all 4 extremities. Psychiatric: Normal mood and affect.    Laboratory Data: Basic Metabolic Panel (BMP) Order: [id number] Component Ref Range & Units 8 d ago  Glucose 70 - 110 mg/dL 469 High   Sodium 629 - 145 mmol/L 140  Potassium 3.6 - 5.1 mmol/L 4.5  Chloride 97 - 109 mmol/L 108  Carbon Dioxide (CO2) 22.0 - 32.0 mmol/L 23.7  Calcium 8.7 - 10.3 mg/dL 9.9  Urea Nitrogen (BUN) 7 - 25 mg/dL 19  Creatinine 0.6 - 1.1 mg/dL 0.8  Glomerular Filtration Rate (eGFR) >60 mL/min/1.73sq m 75  Comment: CKD-EPI (2021) does not include patient's race in the calculation of eGFR.  Monitoring changes of plasma creatinine and eGFR over time is useful for monitoring kidney function.  Interpretive Ranges for eGFR (CKD-EPI 2021):  eGFR:       >60 mL/min/1.73 sq. m - Normal eGFR:       30-59 mL/min/1.73 sq. m - Moderately Decreased eGFR:       15-29 mL/min/1.73 sq. m  - Severely Decreased eGFR:       < 15 mL/min/1.73 sq. m  - Kidney Failure   Note: These eGFR calculations do not apply in acute situations when eGFR is changing rapidly or patients on dialysis.  BUN/Crea Ratio 6.0 - 20.0 23.8 High   Anion Gap w/K 6.0 - 16.0 12.8  Resulting Agency [facility]   Specimen Collected: 06/10/23 08:11   Performed by: [facility] Last Resulted: 06/10/23 13:15  Received From: [facility]  Result Received: 06/18/23 09:48    CBC w/auto Differential (5 Part) Order: [id number] Component Ref Range & Units 8 d ago  WBC (White Blood Cell Count) 4.1 - 10.2 10^3/uL 4.1  RBC (Red Blood Cell Count) 4.04 - 5.48 10^6/uL 3.90 Low   Hemoglobin 12.0 - 15.0 gm/dL 01.0 Low   Hematocrit 27.2 - 47.0 % 33.6 Low   MCV (Mean Corpuscular Volume) 80.0 - 100.0 fl 86.2  MCH (Mean Corpuscular Hemoglobin) 27.0 - 31.2 pg 26.4 Low   MCHC (Mean Corpuscular Hemoglobin Concentration) 32.0 - 36.0 gm/dL 53.6 Low   Platelet Count 150 -  450 10^3/uL 95 Low   RDW-CV (Red Cell Distribution Width) 11.6 - 14.8 % 14.9 High   MPV (Mean Platelet Volume) 9.4 - 12.4 fl 11.3  Neutrophils 1.50 - 7.80 10^3/uL 1.78  Lymphocytes 1.00 - 3.60 10^3/uL 1.82  Monocytes 0.00 - 1.50 10^3/uL 0.32  Eosinophils 0.00 - 0.55 10^3/uL 0.12  Basophils 0.00 - 0.09 10^3/uL 0.01  Neutrophil % 32.0 - 70.0 % 43.6  Lymphocyte % 10.0 - 50.0 % 44.5  Monocyte % 4.0 - 13.0 % 7.8  Eosinophil % 1.0 - 5.0 % 2.9  Basophil% 0.0 - 2.0 % 0.2  Immature Granulocyte % <=0.7 % 1.0 High   Immature Granulocyte Count <=0.06 10^3/L 0.04  Resulting Agency [facility]  Narrative Performed by [facility] Smear review agrees with analyzer results  Specimen Collected: 06/10/23 08:11   Performed by: [facility] Last Resulted: 06/10/23 11:41  Received From: [facility]  Result Received: 06/18/23 09:48    Hemoglobin A1C Order: [id number] Component Ref Range & Units 8 d ago  Hemoglobin A1C 4.2 - 5.6 % 8.3 High   Average Blood Glucose (Calc) mg/dL 086  Resulting Agency [facility]  Narrative Performed by [facility] Normal Range:    4.2 - 5.6% Increased Risk:  5.7 -  6.4% Diabetes:        >= 6.5% Glycemic Control for adults with diabetes:  <7%    Specimen Collected: 06/10/23 08:11   Performed by: [facility] Last Resulted: 06/10/23 10:00  Received From: [facility]  Result Received: 06/18/23 09:48  I have reviewed the labs.   Pertinent Imaging: N/A  Assessment & Plan:    1. rUTI's -Continue preventative strategies ***  2. Urge incontinence -She is having significant leakage causing irritation to the perineal area -She will have a trial of Gemtesa 75 mg, #42 samples given                                             No follow-ups on file.  These notes generated with voice recognition software. I apologize for typographical errors.  [name]  [facility] [address]  [address] [place], [place] [address] [phone]

## 2023-06-19 ENCOUNTER — Encounter: Payer: Self-pay | Admitting: Urology

## 2023-06-19 ENCOUNTER — Ambulatory Visit: Payer: Medicare PPO | Admitting: Urology

## 2023-06-19 VITALS — BP 113/80 | HR 73 | Ht 66.0 in | Wt 170.0 lb

## 2023-06-19 DIAGNOSIS — N3941 Urge incontinence: Secondary | ICD-10-CM | POA: Diagnosis not present

## 2023-06-19 DIAGNOSIS — L309 Dermatitis, unspecified: Secondary | ICD-10-CM | POA: Diagnosis not present

## 2023-06-19 DIAGNOSIS — Z87440 Personal history of urinary (tract) infections: Secondary | ICD-10-CM | POA: Diagnosis not present

## 2023-06-19 DIAGNOSIS — N390 Urinary tract infection, site not specified: Secondary | ICD-10-CM

## 2023-06-19 LAB — BLADDER SCAN AMB NON-IMAGING: Scan Result: 2

## 2023-06-19 MED ORDER — GEMTESA 75 MG PO TABS
75.0000 mg | ORAL_TABLET | Freq: Every day | ORAL | 3 refills | Status: DC
Start: 2023-06-19 — End: 2023-09-10

## 2023-06-19 MED ORDER — CAVILON DURABLE BARRIER 1.3 % EX CREA
TOPICAL_CREAM | CUTANEOUS | 3 refills | Status: AC
Start: 2023-06-19 — End: ?

## 2023-09-09 NOTE — Progress Notes (Deleted)
09/11/2023 8:48 AM   Monica Hess 10/11/1940 [id number]  Referring provider: Gracelyn Nurse, MD [address] [facility] [place],  [place] [address]  Urological history: 1. rUTI's -Contributing factors of age, GSM and diabetes -CT (12/2022) - no nidus for infections -Documented urine cultures over the last year  01/11/2023 - Enterococcus faecalis  12/09/2022- Proteus mirabilis  -Trimethoprim 100 mg daily  No chief complaint on file.  HPI: Monica Hess is a 82 y.o. female who presents today for 3 months follow-up after trial of Gemtesa for urge incontinence.  Previous records reviewed.    PVR *** mL  PMH: Past Medical History:  Diagnosis Date   Back pain    Depression    Diabetes mellitus without complication (HCC)    GERD (gastroesophageal reflux disease)    Hypercholesterolemia    Hypertension     Surgical History: Past Surgical History:  Procedure Laterality Date   APPENDECTOMY     COLONOSCOPY     COLONOSCOPY WITH PROPOFOL N/A 07/25/2015   Procedure: COLONOSCOPY WITH PROPOFOL;  Surgeon: Scot Jun, MD;  Location: [facility] ENDOSCOPY;  Service: Endoscopy;  Laterality: N/A;   ESOPHAGOGASTRODUODENOSCOPY     ESOPHAGOGASTRODUODENOSCOPY (EGD) WITH PROPOFOL N/A 07/25/2015   Procedure: ESOPHAGOGASTRODUODENOSCOPY (EGD) WITH PROPOFOL;  Surgeon: Scot Jun, MD;  Location: [facility] ENDOSCOPY;  Service: Endoscopy;  Laterality: N/A;   EYE SURGERY      Home Medications:  Allergies as of 09/11/2023       Reactions   Codeine Sulfate    Lipitor [atorvastatin]    Nitrofurantoin    Vytorin [ezetimibe-simvastatin]         Medication List        Accurate as of September 09, 2023  8:48 AM. If you have any questions, ask your nurse or doctor.          Azor 5-40 MG tablet Generic drug: amLODipine-olmesartan Take 1 tablet by mouth daily.   Cavilon Durable Barrier 1.3 % Crea Generic drug: Dimethicone Use liberally to the perineum daily    celecoxib 200 MG capsule Commonly known as: CELEBREX   cephALEXin 500 MG capsule Commonly known as: KEFLEX Take 1 capsule (500 mg total) by mouth 4 (four) times daily.   cholestyramine 4 g packet Commonly known as: QUESTRAN Take 4 g by mouth 3 (three) times daily with meals.   ciprofloxacin 250 MG tablet Commonly known as: CIPRO   estrogens (conjugated) 0.3 MG tablet Commonly known as: PREMARIN Take 0.3 mg by mouth daily. Take daily for 21 days then do not take for 7 days.   Fifty50 Glucose Meter 2.0 w/Device Kit Freestyle Meter. Use as directed to check blood glucose Dx E11.65   fluticasone 50 MCG/ACT nasal spray Commonly known as: FLONASE Place into both nostrils daily.   Gemtesa 75 MG Tabs Generic drug: Vibegron Take 1 tablet (75 mg total) by mouth daily.   Gemtesa 75 MG Tabs Generic drug: Vibegron Take 1 tablet (75 mg total) by mouth daily.   glipiZIDE 5 MG tablet Commonly known as: GLUCOTROL Take by mouth daily before breakfast.   hydrALAZINE 25 MG tablet Commonly known as: APRESOLINE   metFORMIN 500 MG tablet Commonly known as: GLUCOPHAGE Take by mouth 2 (two) times daily with a meal.   omeprazole 20 MG capsule Commonly known as: PRILOSEC Take 20 mg by mouth daily.   pioglitazone 15 MG tablet Commonly known as: ACTOS Take 15 mg by mouth daily.   trimethoprim 100 MG tablet Commonly  known as: TRIMPEX Take 1 tablet (100 mg total) by mouth daily.        Allergies:  Allergies  Allergen Reactions   Codeine Sulfate    Lipitor [Atorvastatin]    Nitrofurantoin    Vytorin [Ezetimibe-Simvastatin]     Family History: Family History  Problem Relation Age of Onset   Breast cancer Neg Hx     Social History:  reports that she has quit smoking. She does not have any smokeless tobacco history on file. She reports that she does not currently use alcohol. No history on file for drug use.  ROS: Pertinent ROS in HPI  Physical Exam: There were no  vitals taken for this visit.  Constitutional:  Well nourished. Alert and oriented, No acute distress. HEENT: [place] AT, moist mucus membranes.  Trachea midline, no masses. Cardiovascular: No clubbing, cyanosis, or edema. Respiratory: Normal respiratory effort, no increased work of breathing. GU: No CVA tenderness.  No bladder fullness or masses.  Recession of labia minora, dry, pale vulvar vaginal mucosa and loss of mucosal ridges and folds.  Normal urethral meatus, no lesions, no prolapse, no discharge.   No urethral masses, tenderness and/or tenderness. No bladder fullness, tenderness or masses. *** vagina mucosa, *** estrogen effect, no discharge, no lesions, *** pelvic support, *** cystocele and *** rectocele noted.  No cervical motion tenderness.  Uterus is freely mobile and non-fixed.  No adnexal/parametria masses or tenderness noted.  Anus and perineum are without rashes or lesions.   ***  Neurologic: Grossly intact, no focal deficits, moving all 4 extremities. Psychiatric: Normal mood and affect.    Laboratory Data: N/A    Pertinent Imaging: ***  Assessment & Plan:    1. rUTI's -Continue preventative strategies  2. Urge incontinence -At goal on Gemtesa -According to epic, the [name] is covered by her pharmacy through [facility] so I will send a prescription to them -Will have her follow-up in 3 months for OAB questionnaire and PVR     -Gave her some Gemtesa samples today to bridge the gap, so she would not lose any days without the medication  3. Dermatitis -Still having issues with dermatitis caused by the irritation of her depends, she did not find Desitin cream useful as she states it is sticking -Sent a prescription in for dimethicone cream as an alternative                                  No follow-ups on file.  These notes generated with voice recognition software. I apologize for typographical errors.  Monica Hess  [facility] [address]  [address] [place], [place] [address] [phone]

## 2023-09-10 ENCOUNTER — Telehealth: Payer: Self-pay | Admitting: Urology

## 2023-09-10 DIAGNOSIS — N3941 Urge incontinence: Secondary | ICD-10-CM

## 2023-09-10 MED ORDER — GEMTESA 75 MG PO TABS
75.0000 mg | ORAL_TABLET | Freq: Every day | ORAL | 3 refills | Status: DC
Start: 2023-09-10 — End: 2023-10-02

## 2023-09-10 NOTE — Telephone Encounter (Signed)
Patient called and would like Gemtesa prescription sent to [facility] close the [employer] in [place] .

## 2023-09-10 NOTE — Telephone Encounter (Signed)
Spoke with patient and advised results rx sent to pharmacy by e-script  

## 2023-09-11 ENCOUNTER — Ambulatory Visit: Payer: Medicare PPO | Admitting: Urology

## 2023-09-11 DIAGNOSIS — N3941 Urge incontinence: Secondary | ICD-10-CM

## 2023-09-11 DIAGNOSIS — N390 Urinary tract infection, site not specified: Secondary | ICD-10-CM

## 2023-09-11 DIAGNOSIS — L309 Dermatitis, unspecified: Secondary | ICD-10-CM

## 2023-10-01 NOTE — Progress Notes (Signed)
 10/02/2023 3:45 PM   Monica Hess Jul 09, 1941 [id number]  Referring provider: Rudolpho Norleen BIRCH, MD [address] [facility] [place],  [place] [address]  Urological history: 1. rUTI's -Contributing factors of age, GSM and diabetes -CT (12/2022) - no nidus for infections -Documented urine cultures over the last year  01/11/2023 - Enterococcus faecalis  12/09/2022- Proteus mirabilis  -Trimethoprim  100 mg daily  Chief Complaint  Patient presents with   Follow-up   Over Active Bladder   HPI: Monica Hess is a 83 y.o. female who presents today for 3 months follow-up after trial of Gemtesa  for urge incontinence.  Previous records reviewed.   She is having 1-7 daytime voids, 3 more episodes of nocturia.  She has urge incontinence.  She is leaking 3 more times a day.  She wears depends daily.  She does engage in toilet mapping.  Patient denies any modifying or aggravating factors.  Patient denies any recent UTI's, gross hematuria, dysuria or suprapubic/flank pain.  Patient denies any fevers, chills, nausea or vomiting.    The Gemtesa  is thousand dollars out-of-pocket and she cannot afford this even though it works well in controlling her incontinence.  PVR 0 mL  PMH: Past Medical History:  Diagnosis Date   Back pain    Depression    Diabetes mellitus without complication (HCC)    GERD (gastroesophageal reflux disease)    Hypercholesterolemia    Hypertension     Surgical History: Past Surgical History:  Procedure Laterality Date   APPENDECTOMY     COLONOSCOPY     COLONOSCOPY WITH PROPOFOL  N/A 07/25/2015   Procedure: COLONOSCOPY WITH PROPOFOL ;  Surgeon: Lamar ONEIDA Holmes, MD;  Location: [facility] ENDOSCOPY;  Service: Endoscopy;  Laterality: N/A;   ESOPHAGOGASTRODUODENOSCOPY     ESOPHAGOGASTRODUODENOSCOPY (EGD) WITH PROPOFOL  N/A 07/25/2015   Procedure: ESOPHAGOGASTRODUODENOSCOPY (EGD) WITH PROPOFOL ;  Surgeon: Lamar ONEIDA Holmes, MD;  Location: [facility] ENDOSCOPY;  Service:  Endoscopy;  Laterality: N/A;   EYE SURGERY      Home Medications:  Allergies as of 10/02/2023       Reactions   Codeine Sulfate    Lipitor [atorvastatin]    Nitrofurantoin    Vytorin [ezetimibe-simvastatin]         Medication List        Accurate as of October 02, 2023  3:45 PM. If you have any questions, ask your nurse or doctor.          STOP taking these medications    Gemtesa  75 MG Tabs Generic drug: Vibegron  Stopped by: CLOTILDA Victorino Fatzinger       TAKE these medications    Azor 5-40 MG tablet Generic drug: amLODipine-olmesartan Take 1 tablet by mouth daily.   Cavilon Durable Barrier 1.3 % Crea Generic drug: Dimethicone Use liberally to the perineum daily   celecoxib 200 MG capsule Commonly known as: CELEBREX   cephALEXin  500 MG capsule Commonly known as: KEFLEX  Take 1 capsule (500 mg total) by mouth 4 (four) times daily.   cholestyramine 4 g packet Commonly known as: QUESTRAN Take 4 g by mouth 3 (three) times daily with meals.   ciprofloxacin 250 MG tablet Commonly known as: CIPRO   estrogens  (conjugated) 0.3 MG tablet Commonly known as: PREMARIN  Take 0.3 mg by mouth daily. Take daily for 21 days then do not take for 7 days.   Fifty50 Glucose Meter 2.0 w/Device Kit Freestyle Meter. Use as directed to check blood glucose Dx E11.65   fluticasone 50 MCG/ACT nasal spray Commonly  known as: FLONASE Place into both nostrils daily.   glipiZIDE 5 MG tablet Commonly known as: GLUCOTROL Take by mouth daily before breakfast.   hydrALAZINE 25 MG tablet Commonly known as: APRESOLINE   metFORMIN 500 MG tablet Commonly known as: GLUCOPHAGE Take by mouth 2 (two) times daily with a meal.   omeprazole 20 MG capsule Commonly known as: PRILOSEC Take 20 mg by mouth daily.   pioglitazone 15 MG tablet Commonly known as: ACTOS Take 15 mg by mouth daily.   trimethoprim  100 MG tablet Commonly known as: TRIMPEX  Take 1 tablet (100 mg total) by mouth daily.    Trospium  Chloride 60 MG Cp24 Take 1 capsule (60 mg total) by mouth daily. Started by: CLOTILDA CORNWALL        Allergies:  Allergies  Allergen Reactions   Codeine Sulfate    Lipitor [Atorvastatin]    Nitrofurantoin    Vytorin [Ezetimibe-Simvastatin]     Family History: Family History  Problem Relation Age of Onset   Breast cancer Neg Hx     Social History:  reports that she has quit smoking. She has been exposed to tobacco smoke. She does not have any smokeless tobacco history on file. She reports that she does not currently use alcohol. No history on file for drug use.  ROS: Pertinent ROS in HPI  Physical Exam: Ht 5' 6 (1.676 m)   Wt 170 lb (77.1 kg)   BMI 27.44 kg/m   Constitutional:  Well nourished. Alert and oriented, No acute distress. HEENT: [place] AT, moist mucus membranes.  Trachea midline Cardiovascular: No clubbing, cyanosis, or edema. Respiratory: Normal respiratory effort, no increased work of breathing. Neurologic: Grossly intact, no focal deficits, moving all 4 extremities. Psychiatric: Normal mood and affect.    Laboratory Data: N/A    Pertinent Imaging:  10/02/23 14:58  Scan Result 0ml    Assessment & Plan:    1. rUTI's -Continue preventative strategies  2. Urge incontinence -At goal on Gemtesa , but it is cost prohibitive -we will switch to [place]  XL 60 mg daily, we discussed the side effects of dry eye, dry mouth, constipation and a small chance of causing dementia  -she would like to give it a try  -Prescription sent to pharmacy and she will follow-up in 12 weeks                                   Return for Follow-up in 12 weeks for OAB questionnaire and PVR.  These notes generated with voice recognition software. I apologize for typographical errors.  CLOTILDA CORNWALL RIGGERS  [facility] [address]  [address] [place], [place] [address] [phone]

## 2023-10-02 ENCOUNTER — Ambulatory Visit: Payer: Medicare PPO | Admitting: Urology

## 2023-10-02 ENCOUNTER — Encounter: Payer: Self-pay | Admitting: Urology

## 2023-10-02 VITALS — Ht 66.0 in | Wt 170.0 lb

## 2023-10-02 DIAGNOSIS — N3941 Urge incontinence: Secondary | ICD-10-CM

## 2023-10-02 DIAGNOSIS — Z87440 Personal history of urinary (tract) infections: Secondary | ICD-10-CM

## 2023-10-02 DIAGNOSIS — N390 Urinary tract infection, site not specified: Secondary | ICD-10-CM

## 2023-10-02 LAB — BLADDER SCAN AMB NON-IMAGING

## 2023-10-02 MED ORDER — TROSPIUM CHLORIDE ER 60 MG PO CP24
1.0000 | ORAL_CAPSULE | Freq: Every day | ORAL | 3 refills | Status: DC
Start: 2023-10-02 — End: 2023-10-08

## 2023-10-02 NOTE — Patient Instructions (Signed)
 The number to call to see if you qualify for patient assistance program for Monica Hess is [phone].

## 2023-10-04 ENCOUNTER — Telehealth: Payer: Self-pay

## 2023-10-04 NOTE — Telephone Encounter (Signed)
 Spoke with patient's son and advised results.   Rock Ruth (Key: [id number]) PA Case ID #: [id number] Rx #: [id number] Need Help? Call us  at [phone] Outcome Approved today by [employer] 2017 RjdzPi:[id number];Dujuld:Jeemnczi;Review Type:Prior Auth;Coverage Start Date:09/04/2023;Coverage End Date:09/23/2098; Authorization Expiration Date: 09/22/2098

## 2023-10-04 NOTE — Telephone Encounter (Signed)
 Female states the the medication that was rxed at [facility] for pt - (Trospium ) requires a PA. He is requesting that to be completed.   Pls advise. Marland Kitchen

## 2023-10-08 ENCOUNTER — Other Ambulatory Visit: Payer: Self-pay | Admitting: *Deleted

## 2023-10-08 DIAGNOSIS — N3941 Urge incontinence: Secondary | ICD-10-CM

## 2023-10-08 MED ORDER — TROSPIUM CHLORIDE ER 60 MG PO CP24
1.0000 | ORAL_CAPSULE | Freq: Every day | ORAL | 3 refills | Status: DC
Start: 2023-10-08 — End: 2024-05-20

## 2023-11-21 ENCOUNTER — Other Ambulatory Visit: Payer: Self-pay | Admitting: Internal Medicine

## 2023-11-21 DIAGNOSIS — Z1231 Encounter for screening mammogram for malignant neoplasm of breast: Secondary | ICD-10-CM

## 2023-11-27 ENCOUNTER — Inpatient Hospital Stay: Payer: Medicare PPO | Attending: Oncology | Admitting: Oncology

## 2023-11-27 ENCOUNTER — Encounter: Payer: Self-pay | Admitting: Oncology

## 2023-11-27 ENCOUNTER — Inpatient Hospital Stay: Payer: Medicare PPO

## 2023-11-27 VITALS — BP 174/66 | HR 69 | Temp 97.4°F | Resp 16 | Wt 170.0 lb

## 2023-11-27 DIAGNOSIS — Z79899 Other long term (current) drug therapy: Secondary | ICD-10-CM | POA: Insufficient documentation

## 2023-11-27 DIAGNOSIS — D509 Iron deficiency anemia, unspecified: Secondary | ICD-10-CM

## 2023-11-27 DIAGNOSIS — D508 Other iron deficiency anemias: Secondary | ICD-10-CM

## 2023-11-27 LAB — FERRITIN: Ferritin: 10 ng/mL — ABNORMAL LOW (ref 11–307)

## 2023-11-27 LAB — RETICULOCYTES
Immature Retic Fract: 19.9 % — ABNORMAL HIGH (ref 2.3–15.9)
RBC.: 4.12 MIL/uL (ref 3.87–5.11)
Retic Count, Absolute: 76.2 10*3/uL (ref 19.0–186.0)
Retic Ct Pct: 1.9 % (ref 0.4–3.1)

## 2023-11-27 LAB — CBC WITH DIFFERENTIAL/PLATELET
Abs Immature Granulocytes: 0.04 10*3/uL (ref 0.00–0.07)
Basophils Absolute: 0 10*3/uL (ref 0.0–0.1)
Basophils Relative: 0 %
Eosinophils Absolute: 0.2 10*3/uL (ref 0.0–0.5)
Eosinophils Relative: 3 %
HCT: 32.8 % — ABNORMAL LOW (ref 36.0–46.0)
Hemoglobin: 9.6 g/dL — ABNORMAL LOW (ref 12.0–15.0)
Immature Granulocytes: 1 %
Lymphocytes Relative: 42 %
Lymphs Abs: 2.1 10*3/uL (ref 0.7–4.0)
MCH: 23 pg — ABNORMAL LOW (ref 26.0–34.0)
MCHC: 29.3 g/dL — ABNORMAL LOW (ref 30.0–36.0)
MCV: 78.7 fL — ABNORMAL LOW (ref 80.0–100.0)
Monocytes Absolute: 0.3 10*3/uL (ref 0.1–1.0)
Monocytes Relative: 6 %
Neutro Abs: 2.4 10*3/uL (ref 1.7–7.7)
Neutrophils Relative %: 48 %
Platelets: 107 10*3/uL — ABNORMAL LOW (ref 150–400)
RBC: 4.17 MIL/uL (ref 3.87–5.11)
RDW: 17.2 % — ABNORMAL HIGH (ref 11.5–15.5)
Smear Review: NORMAL
WBC: 5 10*3/uL (ref 4.0–10.5)
nRBC: 0 % (ref 0.0–0.2)

## 2023-11-27 LAB — COMPREHENSIVE METABOLIC PANEL
ALT: 21 U/L (ref 0–44)
AST: 29 U/L (ref 15–41)
Albumin: 4 g/dL (ref 3.5–5.0)
Alkaline Phosphatase: 80 U/L (ref 38–126)
Anion gap: 8 (ref 5–15)
BUN: 27 mg/dL — ABNORMAL HIGH (ref 8–23)
CO2: 19 mmol/L — ABNORMAL LOW (ref 22–32)
Calcium: 9.4 mg/dL (ref 8.9–10.3)
Chloride: 105 mmol/L (ref 98–111)
Creatinine, Ser: 0.77 mg/dL (ref 0.44–1.00)
GFR, Estimated: >60 mL/min (ref >60)
Glucose, Bld: 205 mg/dL — ABNORMAL HIGH (ref 70–99)
Potassium: 4.2 mmol/L (ref 3.5–5.1)
Sodium: 132 mmol/L — ABNORMAL LOW (ref 135–145)
Total Bilirubin: 0.7 mg/dL (ref 0.0–1.2)
Total Protein: 8.1 g/dL (ref 6.5–8.1)

## 2023-11-27 LAB — IRON AND TIBC
Iron: 31 ug/dL (ref 28–170)
Iron: 34 ug/dL (ref 28–170)
Saturation Ratios: 6 % — ABNORMAL LOW (ref 10.4–31.8)
Saturation Ratios: 7 % — ABNORMAL LOW (ref 10.4–31.8)
TIBC: 504 ug/dL — ABNORMAL HIGH (ref 250–450)
TIBC: 526 ug/dL — ABNORMAL HIGH (ref 250–450)
UIBC: 473 ug/dL
UIBC: 492 ug/dL

## 2023-11-27 LAB — VITAMIN B12: Vitamin B-12: 229 pg/mL (ref 180–914)

## 2023-11-27 LAB — FOLATE: Folate: 30 ng/mL (ref >5.9)

## 2023-11-27 LAB — TSH: TSH: 2.187 u[IU]/mL (ref 0.350–4.500)

## 2023-11-28 ENCOUNTER — Encounter: Payer: Self-pay | Admitting: Oncology

## 2023-11-28 DIAGNOSIS — D509 Iron deficiency anemia, unspecified: Secondary | ICD-10-CM | POA: Insufficient documentation

## 2023-11-28 LAB — KAPPA/LAMBDA LIGHT CHAINS
Kappa free light chain: 35.6 mg/L — ABNORMAL HIGH (ref 3.3–19.4)
Kappa, lambda light chain ratio: 1.52 (ref 0.26–1.65)
Lambda free light chains: 23.4 mg/L (ref 5.7–26.3)

## 2023-11-28 NOTE — Progress Notes (Signed)
 Hematology/Oncology Consult note [facility] [phone] [phone]  Patient Care Team: Gracelyn Nurse, MD as PCP - General (Internal Medicine) Creig Hines, MD as Consulting Physician (Oncology)   Name of the patient: Monica Hess  [id number]  29-May-1941    Reason for referral-microcytic anemia   Referring physician-Dr. Letitia Libra  Date of visit: 11/28/23   History of presenting illness- Patient is a 83 year old female with a past medical history significant for hypertension hyperlipidemia GERD type 2 diabetes who has been referred for microcytic anemia.  Labs from 11/07/2023 showed an H&H of 9.7/22.2 with an MCV of 79.  White count and platelets were normal.  We do not have any recent iron studies for her.  Of note her hemoglobin has been between 9-10 over the last 3 years.  Patient has had prior colonoscopy within the last 3 years although I do not have those reports with me.  That was unremarkable per patient currently patient denies any blood loss in her stool or urine.  Denies any dark melanotic stools.  She has tried oral iron in the past but that gives her constipation.  Denies any consistent use of NSAIDs or family history of colon cancer  ECOG PS- 1  Pain scale- 0   Review of systems- Review of Systems  Constitutional:  Positive for malaise/fatigue. Negative for chills, fever and weight loss.  HENT:  Negative for congestion, ear discharge and nosebleeds.   Eyes:  Negative for blurred vision.  Respiratory:  Negative for cough, hemoptysis, sputum production, shortness of breath and wheezing.   Cardiovascular:  Negative for chest pain, palpitations, orthopnea and claudication.  Gastrointestinal:  Negative for abdominal pain, blood in stool, constipation, diarrhea, heartburn, melena, nausea and vomiting.  Genitourinary:  Negative for dysuria, flank pain, frequency, hematuria and urgency.  Musculoskeletal:  Negative for back pain, joint  pain and myalgias.  Skin:  Negative for rash.  Neurological:  Negative for dizziness, tingling, focal weakness, seizures, weakness and headaches.  Endo/Heme/Allergies:  Does not bruise/bleed easily.  Psychiatric/Behavioral:  Negative for depression and suicidal ideas. The patient does not have insomnia.     Allergies  Allergen Reactions   Codeine Sulfate    Lipitor [Atorvastatin]    Nitrofurantoin    Vytorin [Ezetimibe-Simvastatin]     Patient Active Problem List   Diagnosis Date Noted   Iron deficiency anemia 11/28/2023     Past Medical History:  Diagnosis Date   Back pain    Depression    Diabetes mellitus without complication (HCC)    GERD (gastroesophageal reflux disease)    Hypercholesterolemia    Hypertension      Past Surgical History:  Procedure Laterality Date   APPENDECTOMY     COLONOSCOPY     COLONOSCOPY WITH PROPOFOL N/A 07/25/2015   Procedure: COLONOSCOPY WITH PROPOFOL;  Surgeon: Scot Jun, MD;  Location: [facility] ENDOSCOPY;  Service: Endoscopy;  Laterality: N/A;   ESOPHAGOGASTRODUODENOSCOPY     ESOPHAGOGASTRODUODENOSCOPY (EGD) WITH PROPOFOL N/A 07/25/2015   Procedure: ESOPHAGOGASTRODUODENOSCOPY (EGD) WITH PROPOFOL;  Surgeon: Scot Jun, MD;  Location: [facility] ENDOSCOPY;  Service: Endoscopy;  Laterality: N/A;   EYE SURGERY      Social History   Socioeconomic History   Marital status: Married    Spouse name: Not on file   Number of children: Not on file   Years of education: Not on file   Highest education level: Not on file  Occupational History   Not on file  Tobacco  Use   Smoking status: Former    Passive exposure: Past   Smokeless tobacco: Not on file  Substance and Sexual Activity   Alcohol use: Not Currently   Drug use: Not on file   Sexual activity: Not on file  Other Topics Concern   Not on file  Social History Narrative   Not on file   Social Drivers of Health   Financial Resource Strain: Low Risk  (11/12/2023)   Received  from [facility]   Overall Financial Resource Strain (CARDIA)    Difficulty of Paying Living Expenses: Not very hard  Food Insecurity: No Food Insecurity (11/27/2023)   Hunger Vital Sign    Worried About Running Out of Food in the Last Year: Never true    Ran Out of Food in the Last Year: Never true  Transportation Needs: No Transportation Needs (11/27/2023)   PRAPARE - Administrator, Civil Service (Medical): No    Lack of Transportation (Non-Medical): No  Physical Activity: Not on file  Stress: Not on file  Social Connections: Not on file  Intimate Partner Violence: Not At Risk (11/27/2023)   Humiliation, Afraid, Rape, and Kick questionnaire    Fear of Current or Ex-Partner: No    Emotionally Abused: No    Physically Abused: No    Sexually Abused: No     Family History  Problem Relation Age of Onset   Breast cancer Neg Hx      Current Outpatient Medications:    amLODipine-valsartan (EXFORGE) 10-160 MG tablet, Take 1 tablet by mouth daily., Disp: , Rfl:    Blood Glucose Monitoring Suppl (FIFTY50 GLUCOSE METER 2.0) w/Device KIT, Freestyle Meter. Use as directed to check blood glucose Dx E11.65, Disp: , Rfl:    celecoxib (CELEBREX) 200 MG capsule, , Disp: , Rfl:    cephALEXin (KEFLEX) 500 MG capsule, Take 1 capsule (500 mg total) by mouth 4 (four) times daily., Disp: 28 capsule, Rfl: 0   cholestyramine (QUESTRAN) 4 G packet, Take 4 g by mouth 3 (three) times daily with meals., Disp: , Rfl:    ciprofloxacin (CIPRO) 250 MG tablet, , Disp: , Rfl:    Dimethicone (CAVILON DURABLE BARRIER) 1.3 % CREA, Use liberally to the perineum daily, Disp: 207 g, Rfl: 3   estrogens, conjugated, (PREMARIN) 0.3 MG tablet, Take 0.3 mg by mouth daily. Take daily for 21 days then do not take for 7 days., Disp: , Rfl:    fluticasone (FLONASE) 50 MCG/ACT nasal spray, Place into both nostrils daily., Disp: , Rfl:    gabapentin (NEURONTIN) 100 MG capsule, Take 1 capsule by mouth 2  (two) times daily., Disp: , Rfl:    glipiZIDE (GLUCOTROL) 5 MG tablet, Take by mouth daily before breakfast., Disp: , Rfl:    hydrALAZINE (APRESOLINE) 25 MG tablet, , Disp: , Rfl:    metFORMIN (GLUCOPHAGE) 500 MG tablet, Take by mouth 2 (two) times daily with a meal., Disp: , Rfl:    omeprazole (PRILOSEC) 20 MG capsule, Take 20 mg by mouth daily., Disp: , Rfl:    pioglitazone (ACTOS) 15 MG tablet, Take 15 mg by mouth daily., Disp: , Rfl:    trimethoprim (TRIMPEX) 100 MG tablet, Take 1 tablet (100 mg total) by mouth daily., Disp: 30 tablet, Rfl: 2   Trospium Chloride 60 MG CP24, Take 1 capsule (60 mg total) by mouth daily., Disp: 90 capsule, Rfl: 3   Vitamin D, Ergocalciferol, (DRISDOL) 1.25 MG (50000 UNIT) CAPS capsule, Take  50,000 Units by mouth once a week., Disp: , Rfl:    amLODipine-olmesartan (AZOR) 5-40 MG tablet, Take 1 tablet by mouth daily. (Patient not taking: Reported on 11/27/2023), Disp: , Rfl:    Physical exam:  Vitals:   11/27/23 1427  BP: (!) 174/66  Pulse: 69  Resp: 16  Temp: (!) 97.4 F (36.3 C)  TempSrc: Tympanic  SpO2: 100%  Weight: 170 lb (77.1 kg)   Physical Exam Cardiovascular:     Rate and Rhythm: Normal rate and regular rhythm.     Heart sounds: Normal heart sounds.  Pulmonary:     Effort: Pulmonary effort is normal.     Breath sounds: Normal breath sounds.  Abdominal:     General: Bowel sounds are normal.     Palpations: Abdomen is soft.  Skin:    General: Skin is warm and dry.  Neurological:     Mental Status: She is alert and oriented to person, place, and time.           Latest Ref Rng & Units 11/27/2023    3:07 PM  CMP  Glucose 70 - 99 mg/dL 914   BUN 8 - 23 mg/dL 27   Creatinine 7.82 - 1.00 mg/dL 9.56   Sodium 213 - 086 mmol/L 132   Potassium 3.5 - 5.1 mmol/L 4.2   Chloride 98 - 111 mmol/L 105   CO2 22 - 32 mmol/L 19   Calcium 8.9 - 10.3 mg/dL 9.4   Total Protein 6.5 - 8.1 g/dL 8.1   Total Bilirubin 0.0 - 1.2 mg/dL 0.7   Alkaline  Phos 38 - 126 U/L 80   AST 15 - 41 U/L 29   ALT 0 - 44 U/L 21       Latest Ref Rng & Units 11/27/2023    3:07 PM  CBC  WBC 4.0 - 10.5 K/uL 5.0   Hemoglobin 12.0 - 15.0 g/dL 9.6   Hematocrit 57.8 - 46.0 % 32.8   Platelets 150 - 400 K/uL 107     Assessment and plan- Patient is a 83 y.o. female referred for microcytic anemia  Suspect microcytic anemia secondary to iron deficiency.  I will do a comprehensive anemia workup at this time including CBC with differential CMP ferritin and iron studies B12 folate TSH myeloma panel and serum free light chains.  If labs confirm iron deficiency we will proceed with IV iron.  Which brand of IV iron she gets depends on what her insurance will approve.  Discussed risks and benefits of IV iron including all but not limited to possible risk of infusion reaction.  Patient understands and agrees to proceed as planned.  CBC ferritin and iron studies in 2 months and I will see her thereafter   Thank you for this kind referral and the opportunity to participate in the care of this  Patient   Visit Diagnosis 1. Microcytic anemia   2. Other iron deficiency anemia     Dr. Owens Shark, MD, MPH [facility] at [facility] [phone] 11/28/2023

## 2023-11-29 LAB — HAPTOGLOBIN: Haptoglobin: 113 mg/dL (ref 42–346)

## 2023-12-03 LAB — MULTIPLE MYELOMA PANEL, SERUM
Albumin SerPl Elph-Mcnc: 3.6 g/dL (ref 2.9–4.4)
Albumin/Glob SerPl: 1 (ref 0.7–1.7)
Alpha 1: 0.2 g/dL (ref 0.0–0.4)
Alpha2 Glob SerPl Elph-Mcnc: 0.8 g/dL (ref 0.4–1.0)
B-Globulin SerPl Elph-Mcnc: 1.1 g/dL (ref 0.7–1.3)
Gamma Glob SerPl Elph-Mcnc: 1.6 g/dL (ref 0.4–1.8)
Globulin, Total: 3.8 g/dL (ref 2.2–3.9)
IgA: 232 mg/dL (ref 64–422)
IgG (Immunoglobin G), Serum: 1727 mg/dL — ABNORMAL HIGH (ref 586–1602)
IgM (Immunoglobulin M), Srm: 115 mg/dL (ref 26–217)
M Protein SerPl Elph-Mcnc: 1 g/dL — ABNORMAL HIGH
Total Protein ELP: 7.4 g/dL (ref 6.0–8.5)

## 2023-12-05 ENCOUNTER — Inpatient Hospital Stay

## 2023-12-05 VITALS — BP 174/54 | HR 92 | Temp 97.2°F | Resp 16

## 2023-12-05 DIAGNOSIS — D509 Iron deficiency anemia, unspecified: Secondary | ICD-10-CM | POA: Diagnosis not present

## 2023-12-05 DIAGNOSIS — D508 Other iron deficiency anemias: Secondary | ICD-10-CM

## 2023-12-05 MED ORDER — IRON SUCROSE 20 MG/ML IV SOLN
200.0000 mg | INTRAVENOUS | Status: DC
  Administered 2023-12-05: 200 mg via INTRAVENOUS
  Filled 2023-12-05: qty 10

## 2023-12-10 ENCOUNTER — Inpatient Hospital Stay

## 2023-12-10 VITALS — BP 154/53 | HR 61 | Temp 96.5°F | Resp 19

## 2023-12-10 DIAGNOSIS — D508 Other iron deficiency anemias: Secondary | ICD-10-CM

## 2023-12-10 DIAGNOSIS — D509 Iron deficiency anemia, unspecified: Secondary | ICD-10-CM | POA: Diagnosis not present

## 2023-12-10 MED ORDER — IRON SUCROSE 20 MG/ML IV SOLN
200.0000 mg | INTRAVENOUS | Status: DC
  Administered 2023-12-10: 200 mg via INTRAVENOUS

## 2023-12-10 MED ORDER — SODIUM CHLORIDE 0.9% FLUSH
10.0000 mL | Freq: Once | INTRAVENOUS | Status: AC | PRN
  Administered 2023-12-10: 10 mL
  Filled 2023-12-10: qty 10

## 2023-12-12 ENCOUNTER — Ambulatory Visit
Admission: RE | Admit: 2023-12-12 | Discharge: 2023-12-12 | Disposition: A | Discharge status: Home / Self Care | Payer: Medicare PPO | Source: Ambulatory Visit | Attending: Internal Medicine | Admitting: Internal Medicine

## 2023-12-12 ENCOUNTER — Inpatient Hospital Stay

## 2023-12-12 VITALS — BP 165/52 | HR 70 | Resp 18

## 2023-12-12 DIAGNOSIS — D509 Iron deficiency anemia, unspecified: Secondary | ICD-10-CM | POA: Diagnosis not present

## 2023-12-12 DIAGNOSIS — D508 Other iron deficiency anemias: Secondary | ICD-10-CM

## 2023-12-12 DIAGNOSIS — Z1231 Encounter for screening mammogram for malignant neoplasm of breast: Secondary | ICD-10-CM | POA: Insufficient documentation

## 2023-12-12 MED ORDER — IRON SUCROSE 20 MG/ML IV SOLN
200.0000 mg | INTRAVENOUS | Status: DC
  Administered 2023-12-12: 200 mg via INTRAVENOUS
  Filled 2023-12-12: qty 10

## 2023-12-17 ENCOUNTER — Inpatient Hospital Stay

## 2023-12-17 VITALS — BP 156/60 | HR 65 | Temp 98.2°F | Resp 18

## 2023-12-17 DIAGNOSIS — D509 Iron deficiency anemia, unspecified: Secondary | ICD-10-CM | POA: Diagnosis not present

## 2023-12-17 DIAGNOSIS — D508 Other iron deficiency anemias: Secondary | ICD-10-CM

## 2023-12-17 MED ORDER — IRON SUCROSE 20 MG/ML IV SOLN
200.0000 mg | INTRAVENOUS | Status: DC
  Administered 2023-12-17: 200 mg via INTRAVENOUS

## 2023-12-17 MED ORDER — SODIUM CHLORIDE 0.9% FLUSH
10.0000 mL | Freq: Once | INTRAVENOUS | Status: AC | PRN
  Administered 2023-12-17: 10 mL
  Filled 2023-12-17: qty 10

## 2023-12-19 ENCOUNTER — Inpatient Hospital Stay

## 2023-12-19 VITALS — BP 159/58 | HR 64 | Temp 98.0°F | Resp 18

## 2023-12-19 DIAGNOSIS — D508 Other iron deficiency anemias: Secondary | ICD-10-CM

## 2023-12-19 DIAGNOSIS — D509 Iron deficiency anemia, unspecified: Secondary | ICD-10-CM | POA: Diagnosis not present

## 2023-12-19 MED ORDER — IRON SUCROSE 20 MG/ML IV SOLN
200.0000 mg | INTRAVENOUS | Status: DC
  Administered 2023-12-19: 200 mg via INTRAVENOUS
  Filled 2023-12-19: qty 10

## 2023-12-19 MED ORDER — SODIUM CHLORIDE 0.9% FLUSH
10.0000 mL | Freq: Once | INTRAVENOUS | Status: AC | PRN
  Administered 2023-12-19: 10 mL
  Filled 2023-12-19: qty 10

## 2023-12-30 NOTE — Progress Notes (Unsigned)
 12/31/2023 7:44 PM   Monica Hess 1941/01/12 [id number]  Referring provider: Gracelyn Nurse, MD [address] [facility] [place],  [place] [address]  Urological history: 1. rUTI's -Contributing factors of age, GSM and diabetes -CT (12/2022) - no nidus for infections -Documented urine cultures over the last year  01/11/2023 - Enterococcus faecalis -Trimethoprim 100 mg daily  No chief complaint on file.  HPI: Monica Hess is a 83 y.o. female who presents today for 12 months follow-up after trial of oxybutynin for urge incontinence.  Previous records reviewed.   They are having (1 to 7) or (8 or more) daytime voids,  they are having nocturia (1-2) or (3 or more) and urgency is (none, mild, strong, severe).   They are having (stress, urge or mixed incontinence.)    they are having urinary leakage (1-2 times weekly, 3 or more times weekly, 1-2 times daily and 3 or more times daily) They are using absorbent products for leakage (no, sometimes, always )   the type of products they use are (panty liners, absorbant pads, depends) *** daily.  They are not limiting fluids.  They are not engaging in toilet mapping  ***    The Monica Hess was thousand dollars out-of-pocket and she cannot afford this even though it works well in controlling her incontinence.  UA ***  PVR *** mL  PMH: Past Medical History:  Diagnosis Date   Back pain    Depression    Diabetes mellitus without complication (HCC)    GERD (gastroesophageal reflux disease)    Hypercholesterolemia    Hypertension     Surgical History: Past Surgical History:  Procedure Laterality Date   APPENDECTOMY     COLONOSCOPY     COLONOSCOPY WITH PROPOFOL N/A 07/25/2015   Procedure: COLONOSCOPY WITH PROPOFOL;  Surgeon: Scot Jun, MD;  Location: [facility] ENDOSCOPY;  Service: Endoscopy;  Laterality: N/A;   ESOPHAGOGASTRODUODENOSCOPY     ESOPHAGOGASTRODUODENOSCOPY (EGD) WITH PROPOFOL N/A 07/25/2015   Procedure:  ESOPHAGOGASTRODUODENOSCOPY (EGD) WITH PROPOFOL;  Surgeon: Scot Jun, MD;  Location[facility] ENDOSCOPY;  Service: Endoscopy;  Laterality: N/A;   EYE SURGERY      Home Medications:  Allergies as of 12/31/2023       Reactions   Codeine Sulfate    Lipitor [atorvastatin]    Nitrofurantoin    Vytorin [ezetimibe-simvastatin]         Medication List        Accurate as of December 30, 2023  7:44 PM. If you have any questions, ask your nurse or doctor.          amLODipine-valsartan 10-160 MG tablet Commonly known as: EXFORGE Take 1 tablet by mouth daily.   Azor 5-40 MG tablet Generic drug: amLODipine-olmesartan Take 1 tablet by mouth daily.   Cavilon Durable Barrier 1.3 % Crea Generic drug: Dimethicone Use liberally to the perineum daily   celecoxib 200 MG capsule Commonly known as: CELEBREX   cephALEXin 500 MG capsule Commonly known as: KEFLEX Take 1 capsule (500 mg total) by mouth 4 (four) times daily.   cholestyramine 4 g packet Commonly known as: QUESTRAN Take 4 g by mouth 3 (three) times daily with meals.   ciprofloxacin 250 MG tablet Commonly known as: CIPRO   estrogens (conjugated) 0.3 MG tablet Commonly known as: PREMARIN Take 0.3 mg by mouth daily. Take daily for 21 days then do not take for 7 days.   Fifty50 Glucose Meter 2.0 w/Device Kit Freestyle Meter. Use as  directed to check blood glucose Dx E11.65   fluticasone 50 MCG/ACT nasal spray Commonly known as: FLONASE Place into both nostrils daily.   gabapentin 100 MG capsule Commonly known as: NEURONTIN Take 1 capsule by mouth 2 (two) times daily.   glipiZIDE 5 MG tablet Commonly known as: GLUCOTROL Take by mouth daily before breakfast.   hydrALAZINE 25 MG tablet Commonly known as: APRESOLINE   metFORMIN 500 MG tablet Commonly known as: GLUCOPHAGE Take by mouth 2 (two) times daily with a meal.   omeprazole 20 MG capsule Commonly known as: PRILOSEC Take 20 mg by mouth daily.    pioglitazone 15 MG tablet Commonly known as: ACTOS Take 15 mg by mouth daily.   trimethoprim 100 MG tablet Commonly known as: TRIMPEX Take 1 tablet (100 mg total) by mouth daily.   Trospium Chloride 60 MG Cp24 Take 1 capsule (60 mg total) by mouth daily.   Vitamin D (Ergocalciferol) 1.25 MG (50000 UNIT) Caps capsule Commonly known as: DRISDOL Take 50,000 Units by mouth once a week.        Allergies:  Allergies  Allergen Reactions   Codeine Sulfate    Lipitor [Atorvastatin]    Nitrofurantoin    Vytorin [Ezetimibe-Simvastatin]     Family History: Family History  Problem Relation Age of Onset   Breast cancer Neg Hx     Social History:  reports that she has quit smoking. She has been exposed to tobacco smoke. She does not have any smokeless tobacco history on file. She reports that she does not currently use alcohol. No history on file for drug use.  ROS: Pertinent ROS in HPI  Physical Exam: There were no vitals taken for this visit.  Constitutional:  Well nourished. Alert and oriented, No acute distress. HEENT: [place] AT, moist mucus membranes.  Trachea midline, no masses. Cardiovascular: No clubbing, cyanosis, or edema. Respiratory: Normal respiratory effort, no increased work of breathing. GU: No CVA tenderness.  No bladder fullness or masses.  Recession of labia minora, dry, pale vulvar vaginal mucosa and loss of mucosal ridges and folds.  Normal urethral meatus, no lesions, no prolapse, no discharge.   No urethral masses, tenderness and/or tenderness. No bladder fullness, tenderness or masses. *** vagina mucosa, *** estrogen effect, no discharge, no lesions, *** pelvic support, *** cystocele and *** rectocele noted.  No cervical motion tenderness.  Uterus is freely mobile and non-fixed.  No adnexal/parametria masses or tenderness noted.  Anus and perineum are without rashes or lesions.   ***  Neurologic: Grossly intact, no focal deficits, moving all 4  extremities. Psychiatric: Normal mood and affect.    Laboratory Data: CBC    Component Value Date/Time   WBC 5.0 11/27/2023 1507   RBC 4.12 11/27/2023 1507   RBC 4.17 11/27/2023 1507   HGB 9.6 (L) 11/27/2023 1507   HCT 32.8 (L) 11/27/2023 1507   PLT 107 (L) 11/27/2023 1507   MCV 78.7 (L) 11/27/2023 1507   MCH 23.0 (L) 11/27/2023 1507   MCHC 29.3 (L) 11/27/2023 1507   RDW 17.2 (H) 11/27/2023 1507   LYMPHSABS 2.1 11/27/2023 1507   MONOABS 0.3 11/27/2023 1507   EOSABS 0.2 11/27/2023 1507   BASOSABS 0.0 11/27/2023 1507    CMP     Component Value Date/Time   NA 132 (L) 11/27/2023 1507   K 4.2 11/27/2023 1507   CL 105 11/27/2023 1507   CO2 19 (L) 11/27/2023 1507   GLUCOSE 205 (H) 11/27/2023 1507   BUN 27 (H)  11/27/2023 1507   CREATININE 0.77 11/27/2023 1507   CALCIUM 9.4 11/27/2023 1507   PROT 8.1 11/27/2023 1507   ALBUMIN 4.0 11/27/2023 1507   AST 29 11/27/2023 1507   ALT 21 11/27/2023 1507   ALKPHOS 80 11/27/2023 1507   BILITOT 0.7 11/27/2023 1507   GFRNONAA >60 11/27/2023 1507    Urinalysis See epic and HPI I have reviewed the labs.  See HPI.       Pertinent Imaging: ***   Assessment & Plan:    1. rUTI's -Continue preventative strategies  2. Urge incontinence -At goal on Gemtesa, but it is cost prohibitive -we will switch to Sanctura XL 60 mg daily, we discussed the side effects of dry eye, dry mouth, constipation and a small chance of causing dementia  -she would like to give it a try  -Prescription sent to pharmacy and she will follow-up in 12 weeks                                   No follow-ups on file.  These notes generated with voice recognition software. I apologize for typographical errors.  [name]  [facility] [address]  [address] [place], [place] [address] [phone]

## 2023-12-31 ENCOUNTER — Ambulatory Visit (INDEPENDENT_AMBULATORY_CARE_PROVIDER_SITE_OTHER): Payer: Self-pay | Admitting: Urology

## 2023-12-31 ENCOUNTER — Encounter: Payer: Self-pay | Admitting: Urology

## 2023-12-31 VITALS — BP 147/66 | HR 73 | Ht 66.0 in | Wt 160.0 lb

## 2023-12-31 DIAGNOSIS — N390 Urinary tract infection, site not specified: Secondary | ICD-10-CM | POA: Diagnosis not present

## 2023-12-31 DIAGNOSIS — R3129 Other microscopic hematuria: Secondary | ICD-10-CM | POA: Diagnosis not present

## 2023-12-31 DIAGNOSIS — N3941 Urge incontinence: Secondary | ICD-10-CM | POA: Diagnosis not present

## 2023-12-31 LAB — BLADDER SCAN AMB NON-IMAGING

## 2024-01-01 LAB — URINALYSIS, COMPLETE
Bilirubin, UA: NEGATIVE
Glucose, UA: NEGATIVE
Ketones, UA: NEGATIVE
Nitrite, UA: POSITIVE — AB
Specific Gravity, UA: 1.025 (ref 1.005–1.030)
Urobilinogen, Ur: 0.2 mg/dL (ref 0.2–1.0)
pH, UA: 5.5 (ref 5.0–7.5)

## 2024-01-01 LAB — MICROSCOPIC EXAMINATION: WBC, UA: >30 /HPF — AB (ref 0–5)

## 2024-01-03 ENCOUNTER — Other Ambulatory Visit: Payer: Self-pay | Admitting: Urology

## 2024-01-03 LAB — CULTURE, URINE COMPREHENSIVE

## 2024-01-03 MED ORDER — AMOXICILLIN-POT CLAVULANATE 875-125 MG PO TABS: 1.0000 | ORAL_TABLET | Freq: Two times a day (BID) | ORAL | 0 refills | Status: DC

## 2024-01-28 ENCOUNTER — Encounter: Payer: Self-pay | Admitting: Oncology

## 2024-01-28 ENCOUNTER — Inpatient Hospital Stay: Attending: Oncology

## 2024-01-28 ENCOUNTER — Inpatient Hospital Stay (HOSPITAL_BASED_OUTPATIENT_CLINIC_OR_DEPARTMENT_OTHER): Admitting: Oncology

## 2024-01-28 VITALS — BP 190/80 | HR 66 | Temp 98.7°F | Resp 19 | Wt 160.0 lb

## 2024-01-28 DIAGNOSIS — E538 Deficiency of other specified B group vitamins: Secondary | ICD-10-CM | POA: Diagnosis not present

## 2024-01-28 DIAGNOSIS — D509 Iron deficiency anemia, unspecified: Secondary | ICD-10-CM | POA: Insufficient documentation

## 2024-01-28 DIAGNOSIS — D508 Other iron deficiency anemias: Secondary | ICD-10-CM

## 2024-01-28 LAB — IRON AND TIBC
Iron: 59 ug/dL (ref 28–170)
Saturation Ratios: 16 % (ref 10.4–31.8)
TIBC: 365 ug/dL (ref 250–450)
UIBC: 306 ug/dL

## 2024-01-28 LAB — CBC WITH DIFFERENTIAL (CANCER CENTER ONLY)
Abs Immature Granulocytes: 0.03 10*3/uL (ref 0.00–0.07)
Basophils Absolute: 0 10*3/uL (ref 0.0–0.1)
Basophils Relative: 0 %
Eosinophils Absolute: 0.2 10*3/uL (ref 0.0–0.5)
Eosinophils Relative: 3 %
HCT: 36.7 % (ref 36.0–46.0)
Hemoglobin: 11.4 g/dL — ABNORMAL LOW (ref 12.0–15.0)
Immature Granulocytes: 1 %
Lymphocytes Relative: 34 %
Lymphs Abs: 1.8 10*3/uL (ref 0.7–4.0)
MCH: 27 pg (ref 26.0–34.0)
MCHC: 31.1 g/dL (ref 30.0–36.0)
MCV: 87 fL (ref 80.0–100.0)
Monocytes Absolute: 0.3 10*3/uL (ref 0.1–1.0)
Monocytes Relative: 6 %
Neutro Abs: 3 10*3/uL (ref 1.7–7.7)
Neutrophils Relative %: 56 %
Platelet Count: 107 10*3/uL — ABNORMAL LOW (ref 150–400)
RBC: 4.22 MIL/uL (ref 3.87–5.11)
RDW: 18.3 % — ABNORMAL HIGH (ref 11.5–15.5)
WBC Count: 5.3 10*3/uL (ref 4.0–10.5)
nRBC: 0 % (ref 0.0–0.2)

## 2024-01-28 LAB — FERRITIN: Ferritin: 75 ng/mL (ref 11–307)

## 2024-01-28 NOTE — Progress Notes (Signed)
 Hematology/Oncology Consult note [facility]  Telephone:([phone] [phone]  Patient Care Team: Little Riff, MD as PCP - General (Internal Medicine) Avonne Boettcher, MD as Consulting Physician (Oncology)   Name of the patient: Monica Hess  [id number]  1941/03/29   Date of visit: 01/28/24  Diagnosis-iron  deficiency anemia  Chief complaint/ Reason for visit-routine follow-up of iron  deficiency anemia  Heme/Onc history:  Patient is a 83 year old female with a past medical history significant for hypertension hyperlipidemia GERD type 2 diabetes who has been referred for microcytic anemia.  Labs from 11/07/2023 showed an H&H of 9.7/22.2 with an MCV of 79.  White count and platelets were normal.  We do not have any recent iron  studies for her.  Of note her hemoglobin has been between 9-10 over the last 3 years.  Patient has had prior colonoscopy within the last 3 years although I do not have those reports with me.  That was unremarkable per patient currently patient denies any blood loss in her stool or urine.  Denies any dark melanotic stools.  She has tried oral iron  in the past but that gives her constipation.  Denies any consistent use of NSAIDs or family history of colon cancer    Patient received IV iron  in March 2025.  Last EGD and colonoscopy was in 2016 by Dr. Felicita Horns which was unremarkable was in 2016 which showed short segment Barrett's esophagus colonoscopy showed internal hemorrhoids.  Repeat colonoscopy was recommended in 5 years.  Interval history-patient reports feeling better after receiving IV iron .  Today blood loss in her stool burden 11.  Denies any dark melanotic stools.  She is not presently taking any B12 supplements.  ECOG PS- 1 Pain scale- 0   Review of systems- Review of Systems  Constitutional:  Negative for chills, fever, malaise/fatigue and weight loss.  HENT:  Negative for congestion, ear discharge and nosebleeds.    Eyes:  Negative for blurred vision.  Respiratory:  Negative for cough, hemoptysis, sputum production, shortness of breath and wheezing.   Cardiovascular:  Negative for chest pain, palpitations, orthopnea and claudication.  Gastrointestinal:  Negative for abdominal pain, blood in stool, constipation, diarrhea, heartburn, melena, nausea and vomiting.  Genitourinary:  Negative for dysuria, flank pain, frequency, hematuria and urgency.  Musculoskeletal:  Negative for back pain, joint pain and myalgias.  Skin:  Negative for rash.  Neurological:  Negative for dizziness, tingling, focal weakness, seizures, weakness and headaches.  Endo/Heme/Allergies:  Does not bruise/bleed easily.  Psychiatric/Behavioral:  Negative for depression and suicidal ideas. The patient does not have insomnia.       Allergies  Allergen Reactions   Codeine Sulfate    Lipitor [Atorvastatin]    Nitrofurantoin    Vytorin [Ezetimibe-Simvastatin]      Past Medical History:  Diagnosis Date   Back pain    Depression    Diabetes mellitus without complication (HCC)    GERD (gastroesophageal reflux disease)    Hypercholesterolemia    Hypertension      Past Surgical History:  Procedure Laterality Date   APPENDECTOMY     COLONOSCOPY     COLONOSCOPY WITH PROPOFOL  N/A 07/25/2015   Procedure: COLONOSCOPY WITH PROPOFOL ;  Surgeon: Cassie Click, MD;  Location: [facility] ENDOSCOPY;  Service: Endoscopy;  Laterality: N/A;   ESOPHAGOGASTRODUODENOSCOPY     ESOPHAGOGASTRODUODENOSCOPY (EGD) WITH PROPOFOL  N/A 07/25/2015   Procedure: ESOPHAGOGASTRODUODENOSCOPY (EGD) WITH PROPOFOL ;  Surgeon: Cassie Click, MD;  Location: [facility] ENDOSCOPY;  Service: Endoscopy;  Laterality: N/A;   EYE SURGERY      Social History   Socioeconomic History   Marital status: Married    Spouse name: Not on file   Number of children: Not on file   Years of education: Not on file   Highest education level: Not on file  Occupational History   Not  on file  Tobacco Use   Smoking status: Former    Passive exposure: Past   Smokeless tobacco: Not on file  Substance and Sexual Activity   Alcohol use: Not Currently   Drug use: Not on file   Sexual activity: Not on file  Other Topics Concern   Not on file  Social History Narrative   Not on file   Social Drivers of Health   Financial Resource Strain: Low Risk  (11/12/2023)   Received from [facility]   Overall Financial Resource Strain (CARDIA)    Difficulty of Paying Living Expenses: Not very hard  Food Insecurity: No Food Insecurity (11/27/2023)   Hunger Vital Sign    Worried About Running Out of Food in the Last Year: Never true    Ran Out of Food in the Last Year: Never true  Transportation Needs: No Transportation Needs (11/27/2023)   PRAPARE - Administrator, Civil Service (Medical): No    Lack of Transportation (Non-Medical): No  Physical Activity: Not on file  Stress: Not on file  Social Connections: Not on file  Intimate Partner Violence: Not At Risk (11/27/2023)   Humiliation, Afraid, Rape, and Kick questionnaire    Fear of Current or Ex-Partner: No    Emotionally Abused: No    Physically Abused: No    Sexually Abused: No    Family History  Problem Relation Age of Onset   Breast cancer Neg Hx      Current Outpatient Medications:    amLODipine-valsartan (EXFORGE) 10-160 MG tablet, Take 1 tablet by mouth daily., Disp: , Rfl:    amoxicillin -clavulanate (AUGMENTIN ) 875-125 MG tablet, Take 1 tablet by mouth every 12 (twelve) hours., Disp: 14 tablet, Rfl: 0   Blood Glucose Monitoring Suppl (FIFTY50 GLUCOSE METER 2.0) w/Device KIT, Freestyle Meter. Use as directed to check blood glucose Dx E11.65, Disp: , Rfl:    celecoxib (CELEBREX) 200 MG capsule, , Disp: , Rfl:    cholestyramine (QUESTRAN) 4 G packet, Take 4 g by mouth 3 (three) times daily with meals., Disp: , Rfl:    Dimethicone (CAVILON DURABLE BARRIER) 1.3 % CREA, Use liberally to the  perineum daily, Disp: 207 g, Rfl: 3   estrogens, conjugated, (PREMARIN) 0.3 MG tablet, Take 0.3 mg by mouth daily. Take daily for 21 days then do not take for 7 days., Disp: , Rfl:    gabapentin (NEURONTIN) 100 MG capsule, Take 1 capsule by mouth in the morning, at noon, in the evening, and at bedtime., Disp: , Rfl:    glipiZIDE (GLUCOTROL) 5 MG tablet, Take by mouth daily before breakfast., Disp: , Rfl:    hydrALAZINE (APRESOLINE) 25 MG tablet, , Disp: , Rfl:    metFORMIN (GLUCOPHAGE) 500 MG tablet, Take by mouth 2 (two) times daily with a meal., Disp: , Rfl:    omeprazole (PRILOSEC) 20 MG capsule, Take 20 mg by mouth daily., Disp: , Rfl:    pioglitazone (ACTOS) 15 MG tablet, Take 15 mg by mouth daily., Disp: , Rfl:    trimethoprim  (TRIMPEX ) 100 MG tablet, Take 1 tablet (100 mg total) by mouth daily., Disp:  30 tablet, Rfl: 2   Trospium  Chloride 60 MG CP24, Take 1 capsule (60 mg total) by mouth daily., Disp: 90 capsule, Rfl: 3   Vitamin D, Ergocalciferol, (DRISDOL) 1.25 MG (50000 UNIT) CAPS capsule, Take 50,000 Units by mouth once a week., Disp: , Rfl:   Physical exam:  Vitals:   01/28/24 0936  BP: (!) 190/80  Pulse: 66  Resp: 19  Temp: 98.7 F (37.1 C)  SpO2: 100%  Weight: 160 lb (72.6 kg)   Physical Exam Cardiovascular:     Rate and Rhythm: Normal rate and regular rhythm.     Heart sounds: Normal heart sounds.  Pulmonary:     Effort: Pulmonary effort is normal.     Breath sounds: Normal breath sounds.  Skin:    General: Skin is warm and dry.  Neurological:     Mental Status: She is alert and oriented to person, place, and time.      I have personally reviewed labs listed below:    Latest Ref Rng & Units 11/27/2023    3:07 PM  CMP  Glucose 70 - 99 mg/dL 213   BUN 8 - 23 mg/dL 27   Creatinine 0.86 - 1.00 mg/dL 5.78   Sodium 469 - 629 mmol/L 132   Potassium 3.5 - 5.1 mmol/L 4.2   Chloride 98 - 111 mmol/L 105   CO2 22 - 32 mmol/L 19   Calcium 8.9 - 10.3 mg/dL 9.4   Total  Protein 6.5 - 8.1 g/dL 8.1   Total Bilirubin 0.0 - 1.2 mg/dL 0.7   Alkaline Phos 38 - 126 U/L 80   AST 15 - 41 U/L 29   ALT 0 - 44 U/L 21       Latest Ref Rng & Units 01/28/2024    9:11 AM  CBC  WBC 4.0 - 10.5 K/uL 5.3   Hemoglobin 12.0 - 15.0 g/dL 52.8   Hematocrit 41.3 - 46.0 % 36.7   Platelets 150 - 400 K/uL 107      Assessment and plan- Patient is a 83 y.o. female here for routine follow-up of iron  deficiency anemia.    Hemoglobin significantly improved 11.4 from 9.6 after Treatment with IV iron .  Ferritin levels presently normal at 75 with normal iron  studies and she does not require any IV iron  at this time.  She was found to have a low B12 level back in March 2020 following her last weekly B12 1000 mcg daily.  Will check CBC ferritin and iron  studies in 3 and 6 months and I will see her back in 6 months.  B12 level to be checked in 3 months.  If iron  deficiency recurs I will have a discussion with her regarding GI evaluation   Visit Diagnosis 1. B12 deficiency   2. Other iron  deficiency anemia      Dr. Seretha Dance, MD, MPH [facility] [phone] 01/28/2024 12:29 PM

## 2024-01-29 ENCOUNTER — Other Ambulatory Visit

## 2024-01-29 ENCOUNTER — Ambulatory Visit: Admitting: Oncology

## 2024-03-23 NOTE — Procedures (Signed)
 [facility] - Neurology Department [address]  [place], [place] [address] [phone] (Phone);  [phone] (Fax) Test Date:  03/16/2024  Patient: Monica Hess  DOB: 06/20/1961 Physician: Dr. Arthea Farrow  Chart#: [id number] Sex: Female Ref Phys: Dr. Dodson   Patient History: Patient is a 83 year-old female who presents with bilateral hand numbness, tingling, swelling.  Has left shoulder pain and starting to have some mild right shoulder pain.  Denies neck pain.  Past medical history is significant for diabetes.    Exam: Patchy sensory disturbance is noted in both upper extremities throughout.  EMG & NCV Findings: Evaluation of the Left median motor nerve showed decreased conduction velocity (Elbow-Wrist, 45 m/s).  The Right median motor nerve showed prolonged distal onset latency (5.2 ms) and reduced amplitude (1.2 mV).  The Left ulnar motor and the Right ulnar motor nerves showed reduced amplitude (L3.0, R4.4 mV).  The Left median sensory and the Right median sensory nerves showed no response (Wrist).  The Right radial sensory nerve showed prolonged distal peak latency (2.7 ms).  The Left median/ulnar (palm) comparison and the Right median/ulnar (palm) comparison nerves showed no response (Median Palm).  All remaining nerves (as indicated in the following tables) were within normal limits.   EMG   Side Muscle Nerve Root Ins Act Fibs Psw Amp Dur Poly Recrt Int Bruna Comment  Left Abd Poll Brev Median C8-T1 Nml Nml Nml Nml Nml 0 Nml Nml   Left 1stDorInt Ulnar C8-T1 Nml Nml Nml Nml Nml 0 Nml Nml   Left PronatorTeres Median C6-7 Nml Nml Nml Nml Nml 0 Nml Nml   Left Biceps Musculocut C5-6 Nml Nml Nml Nml Nml 0 Nml Nml   Left Triceps Radial C6-7-8 Nml Nml Nml Nml Nml 0 Nml Nml     Impression: Abnormal study.  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     Thank you for the referral of  this patient. It was our privilege to participate in care of your patient.  Feel free to contact us  with any further questions.   _____________________________ Arthea Farrow, M.D.  Nerve Conduction Studies Anti Sensory Summary Table   Stim Site NR Peak (ms) Norm Peak (ms) P-T Amp (V) Norm P-T Amp Site1 Site2 Dist (cm) Vel (m/s) Norm Vel (m/s)  Left Median Anti Sensory (2nd Digit)  Wrist NR  <3.6  >10 Wrist 2nd Digit 13.0  >39  Right Median Anti Sensory (2nd Digit)  Wrist NR  <3.6  >10 Wrist 2nd Digit 13.0  >39  Left Radial Anti Sensory (Base 1st Digit)  Wrist    2.3 <2.5 21.5  Wrist Base 1st Digit 0.0    Right Radial Anti Sensory (Base 1st Digit)  Wrist    2.7 <2.5 15.9  Wrist Base 1st Digit 0.0    Left Ulnar Anti Sensory (5th Digit)  Wrist    3.1 <3.7 35.7 >15.0 Wrist 5th Digit 12.0 39 >38  Right Ulnar Anti Sensory (5th Digit)  Wrist    3.2 <3.7 21.7 >15.0 Wrist 5th Digit 12.0 38 >38   Motor Summary Table   Stim Site NR Onset (ms) Norm Onset (ms) O-P Amp (mV) Norm O-P Amp Site1 Site2 Dist (cm) Vel (m/s) Norm Vel (m/s)  Left Median Motor (Abd Poll Brev)  Wrist    3.8 <4.5 3.9 >3 Elbow Wrist 21.5 45 >48  Elbow    8.6  1.5  Right Median Motor (Abd Poll Brev)  Wrist    5.2 <4.5 1.2 >3 Elbow Wrist 22.0 56 >48  Elbow    9.1  0.3        Left Ulnar Motor (Abd Dig Minimi)  Wrist    3.4 <3.6 3.0 >5 B Elbow Wrist 22.5 59 >48  B Elbow    7.2  2.9  A Elbow B Elbow 0.0  >48  A Elbow    9.1  2.7        Right Ulnar Motor (Abd Dig Minimi)  Wrist    2.9 <3.6 4.4 >5 B Elbow Wrist 22.0 63 >48  B Elbow    6.4  4.1  A Elbow B Elbow 10.0 50 >48  A Elbow    8.4  1.9         Comparison Summary Table   Stim Site NR Peak (ms) Norm Peak (ms) P-T Amp (V) Site1 Site2 Delta-P (ms) Norm Delta (ms)  Left Median/Ulnar Palm Comparison (Wrist - 8cm)  Median Palm NR  <2.5  Median Palm Ulnar Palm  <0.3  Ulnar Palm    2.1 <2.5 5.9      Right Median/Ulnar Palm Comparison (Wrist - 8cm)  Median Palm  NR  <2.5  Median Palm Ulnar Palm  <0.3  Ulnar Palm    1.6 <2.5 7.2       Waveforms:                             I have reviewed, edited and added to the note as needed to reflect my best personal medical judgment.    Dr. Arthea Farrow, MD [facility] [place], [place] Ph:  [phone] Fax:  [phone]

## 2024-03-24 NOTE — Result Encounter Note (Signed)
 Continue same instruction.

## 2024-04-01 NOTE — Progress Notes (Unsigned)
 04/02/2024 12:47 PM   Monica Hess 09-18-1941 [id number]  Referring provider: Rudolpho Norleen BIRCH, MD [address] [facility] [place],  [place] [address]  Urological history: 1. rUTI's -CT (12/2022) - no nidus for infections - December 31, 2023, E. coli and beta-hemolytic Streptococcus, group B -Trimethoprim  100 mg daily  No chief complaint on file.  HPI: Monica Hess is a 83 y.o. female who presents today for 12 months follow-up after trial of Sanctura  XL 60 mg daily for urge incontinence.  Previous records reviewed.   At her visit on 12/31/2023, she was having 1 to 7 daytime voids,  she was having nocturia 3 or more and urgency was strong.   She was having mixed incontinence.   She was having urinary leakage 3 or more times daily.  She was using absorbent products for leakage always.  The type of products she uses are depends, 4 daily.  She was not limiting fluids.  She was engaging in toilet mapping.  Patient denied any modifying or aggravating factors.  Patient denied any recent UTI's, gross hematuria, dysuria or suprapubic/flank pain.  Patient denied any fevers, chills, nausea or vomiting.  She stated she had an urinary tract infection in February and was given an antibiotic at her symptoms abated.  The Gemtesa  was thousand dollars out-of-pocket and she cannot afford this even though it works well in controlling her incontinence.  In regards to the Sanctura , she states that she just dribbles.  She does not know if the Sanctura  is addressing her urinary symptoms at all.  UA yellow cloudy, specific gravity 1.025, 2+ blood, pH 5.5, 2+ protein, nitrite positive, 2+ leukocyte, greater than 30 WBCs, 11-30 RBCs, 0-10 epithelial cells and many bacteria.  Urine culture was positive for E. coli and beta-hemolytic Streptococcus, group B.   PVR 0 mL.  Since she was not sure whether or not the Sanctura  was helping her urinary symptoms, we decided to do a drug holiday for 3 months to see if her  symptoms worsened while off the medication.  We also need to recheck her UA to ensure the microscopic hematuria resolves after treating her infection.  UA ***  PVR ***  PMH: Past Medical History:  Diagnosis Date   Back pain    Depression    Diabetes mellitus without complication (HCC)    GERD (gastroesophageal reflux disease)    Hypercholesterolemia    Hypertension     Surgical History: Past Surgical History:  Procedure Laterality Date   APPENDECTOMY     COLONOSCOPY     COLONOSCOPY WITH PROPOFOL  N/A 07/25/2015   Procedure: COLONOSCOPY WITH PROPOFOL ;  Surgeon: Lamar ONEIDA Holmes, MD;  [facility] ENDOSCOPY;  Service: Endoscopy;  Laterality: N/A;   ESOPHAGOGASTRODUODENOSCOPY     ESOPHAGOGASTRODUODENOSCOPY (EGD) WITH PROPOFOL  N/A 07/25/2015   Procedure: ESOPHAGOGASTRODUODENOSCOPY (EGD) WITH PROPOFOL ;  Surgeon: Lamar ONEIDA Holmes, MD;  [facility] ENDOSCOPY;  Service: Endoscopy;  Laterality: N/A;   EYE SURGERY      Home Medications:  Allergies as of 04/02/2024       Reactions   Codeine Sulfate    Lipitor [atorvastatin]    Nitrofurantoin    Vytorin [ezetimibe-simvastatin]         Medication List        Accurate as of April 01, 2024 12:47 PM. If you have any questions, ask your nurse or doctor.          amLODipine-valsartan 10-160 MG tablet Commonly known as: EXFORGE Take 1 tablet  by mouth daily.   amoxicillin -clavulanate 875-125 MG tablet Commonly known as: AUGMENTIN  Take 1 tablet by mouth every 12 (twelve) hours.   Cavilon Durable Barrier 1.3 % Crea Generic drug: Dimethicone Use liberally to the perineum daily   celecoxib 200 MG capsule Commonly known as: CELEBREX   cholestyramine 4 g packet Commonly known as: QUESTRAN Take 4 g by mouth 3 (three) times daily with meals.   estrogens  (conjugated) 0.3 MG tablet Commonly known as: PREMARIN  Take 0.3 mg by mouth daily. Take daily for 21 days then do not take for 7 days.   Fifty50 Glucose Meter 2.0  w/Device Kit Freestyle Meter. Use as directed to check blood glucose Dx E11.65   gabapentin 100 MG capsule Commonly known as: NEURONTIN Take 1 capsule by mouth in the morning, at noon, in the evening, and at bedtime.   glipiZIDE 5 MG tablet Commonly known as: GLUCOTROL Take by mouth daily before breakfast.   hydrALAZINE 25 MG tablet Commonly known as: APRESOLINE   metFORMIN 500 MG tablet Commonly known as: GLUCOPHAGE Take by mouth 2 (two) times daily with a meal.   omeprazole 20 MG capsule Commonly known as: PRILOSEC Take 20 mg by mouth daily.   pioglitazone 15 MG tablet Commonly known as: ACTOS Take 15 mg by mouth daily.   trimethoprim  100 MG tablet Commonly known as: TRIMPEX  Take 1 tablet (100 mg total) by mouth daily.   Trospium  Chloride 60 MG Cp24 Take 1 capsule (60 mg total) by mouth daily.   Vitamin D (Ergocalciferol) 1.25 MG (50000 UNIT) Caps capsule Commonly known as: DRISDOL Take 50,000 Units by mouth once a week.        Allergies:  Allergies  Allergen Reactions   Codeine Sulfate    Lipitor [Atorvastatin]    Nitrofurantoin    Vytorin [Ezetimibe-Simvastatin]     Family History: Family History  Problem Relation Age of Onset   Breast cancer Neg Hx     Social History:  reports that she has quit smoking. She has been exposed to tobacco smoke. She does not have any smokeless tobacco history on file. She reports that she does not currently use alcohol. No history on file for drug use.  ROS: Pertinent ROS in HPI  Physical Exam: There were no vitals taken for this visit.  Constitutional:  Well nourished. Alert and oriented, No acute distress. HEENT: [place] AT, moist mucus membranes.  Trachea midline, no masses. Cardiovascular: No clubbing, cyanosis, or edema. Respiratory: Normal respiratory effort, no increased work of breathing. GU: No CVA tenderness.  No bladder fullness or masses.  Recession of labia minora, dry, pale vulvar vaginal mucosa and loss of  mucosal ridges and folds.  Normal urethral meatus, no lesions, no prolapse, no discharge.   No urethral masses, tenderness and/or tenderness. No bladder fullness, tenderness or masses. *** vagina mucosa, *** estrogen effect, no discharge, no lesions, *** pelvic support, *** cystocele and *** rectocele noted.  No cervical motion tenderness.  Uterus is freely mobile and non-fixed.  No adnexal/parametria masses or tenderness noted.  Anus and perineum are without rashes or lesions.   ***  Neurologic: Grossly intact, no focal deficits, moving all 4 extremities. Psychiatric: Normal mood and affect.    Laboratory Data: See epic and HPI I have reviewed the labs.  See HPI.       Pertinent Imaging: N/A    Assessment & Plan:    1. rUTI's -Continue preventative strategies  2. Urge incontinence -***  3. Microscopic  hematuria -  UA ***                                  No follow-ups on file.  These notes generated with voice recognition software. I apologize for typographical errors.  Monica Hess  [facility] [address]  [address] [place], [place] [address] [phone]

## 2024-04-02 ENCOUNTER — Encounter: Payer: Self-pay | Admitting: Urology

## 2024-04-02 ENCOUNTER — Ambulatory Visit (INDEPENDENT_AMBULATORY_CARE_PROVIDER_SITE_OTHER): Admitting: Urology

## 2024-04-02 VITALS — BP 156/74 | HR 73 | Ht 66.0 in | Wt 160.0 lb

## 2024-04-02 DIAGNOSIS — N3941 Urge incontinence: Secondary | ICD-10-CM

## 2024-04-02 DIAGNOSIS — Q525 Fusion of labia: Secondary | ICD-10-CM

## 2024-04-02 DIAGNOSIS — R3129 Other microscopic hematuria: Secondary | ICD-10-CM | POA: Diagnosis not present

## 2024-04-02 DIAGNOSIS — N390 Urinary tract infection, site not specified: Secondary | ICD-10-CM

## 2024-04-02 LAB — URINALYSIS, COMPLETE
Bilirubin, UA: NEGATIVE
Glucose, UA: NEGATIVE
Ketones, UA: NEGATIVE
Nitrite, UA: NEGATIVE
Protein,UA: NEGATIVE
RBC, UA: NEGATIVE
Specific Gravity, UA: 1.02 (ref 1.005–1.030)
Urobilinogen, Ur: 0.2 mg/dL (ref 0.2–1.0)
pH, UA: 6 (ref 5.0–7.5)

## 2024-04-02 LAB — MICROSCOPIC EXAMINATION
Epithelial Cells (non renal): >10 /HPF — AB (ref 0–10)
WBC, UA: >30 /HPF — AB (ref 0–5)

## 2024-04-02 LAB — BLADDER SCAN AMB NON-IMAGING

## 2024-04-02 MED ORDER — PREMARIN 0.625 MG/GM VA CREA: TOPICAL_CREAM | VAGINAL | 12 refills | Status: DC

## 2024-04-28 ENCOUNTER — Ambulatory Visit: Payer: Self-pay | Admitting: Oncology

## 2024-04-28 ENCOUNTER — Inpatient Hospital Stay: Attending: Oncology

## 2024-04-28 DIAGNOSIS — E538 Deficiency of other specified B group vitamins: Secondary | ICD-10-CM | POA: Insufficient documentation

## 2024-04-28 DIAGNOSIS — D508 Other iron deficiency anemias: Secondary | ICD-10-CM

## 2024-04-28 DIAGNOSIS — D509 Iron deficiency anemia, unspecified: Secondary | ICD-10-CM | POA: Diagnosis present

## 2024-04-28 LAB — IRON AND TIBC
Iron: 62 ug/dL (ref 28–170)
Saturation Ratios: 15 % (ref 10.4–31.8)
TIBC: 424 ug/dL (ref 250–450)
UIBC: 362 ug/dL

## 2024-04-28 LAB — CBC WITH DIFFERENTIAL (CANCER CENTER ONLY)
Abs Immature Granulocytes: 0.05 K/uL (ref 0.00–0.07)
Basophils Absolute: 0 K/uL (ref 0.0–0.1)
Basophils Relative: 1 %
Eosinophils Absolute: 0.1 K/uL (ref 0.0–0.5)
Eosinophils Relative: 3 %
HCT: 33.5 % — ABNORMAL LOW (ref 36.0–46.0)
Hemoglobin: 10.6 g/dL — ABNORMAL LOW (ref 12.0–15.0)
Immature Granulocytes: 1 %
Lymphocytes Relative: 38 %
Lymphs Abs: 1.9 K/uL (ref 0.7–4.0)
MCH: 28.6 pg (ref 26.0–34.0)
MCHC: 31.6 g/dL (ref 30.0–36.0)
MCV: 90.3 fL (ref 80.0–100.0)
Monocytes Absolute: 0.3 K/uL (ref 0.1–1.0)
Monocytes Relative: 7 %
Neutro Abs: 2.6 K/uL (ref 1.7–7.7)
Neutrophils Relative %: 50 %
Platelet Count: 101 K/uL — ABNORMAL LOW (ref 150–400)
RBC: 3.71 MIL/uL — ABNORMAL LOW (ref 3.87–5.11)
RDW: 15.8 % — ABNORMAL HIGH (ref 11.5–15.5)
WBC Count: 5.1 K/uL (ref 4.0–10.5)
nRBC: 0 % (ref 0.0–0.2)

## 2024-04-28 LAB — VITAMIN B12: Vitamin B-12: 434 pg/mL (ref 180–914)

## 2024-04-28 LAB — FERRITIN: Ferritin: 25 ng/mL (ref 11–307)

## 2024-04-29 ENCOUNTER — Other Ambulatory Visit

## 2024-05-05 ENCOUNTER — Other Ambulatory Visit: Payer: Self-pay | Admitting: Oncology

## 2024-05-05 ENCOUNTER — Inpatient Hospital Stay

## 2024-05-05 VITALS — BP 150/58 | HR 74 | Temp 97.3°F | Resp 16

## 2024-05-05 DIAGNOSIS — D509 Iron deficiency anemia, unspecified: Secondary | ICD-10-CM | POA: Diagnosis not present

## 2024-05-05 DIAGNOSIS — D508 Other iron deficiency anemias: Secondary | ICD-10-CM

## 2024-05-05 MED ORDER — IRON SUCROSE 20 MG/ML IV SOLN
200.0000 mg | INTRAVENOUS | Status: DC
  Administered 2024-05-05 (×2): 200 mg via INTRAVENOUS
  Filled 2024-05-05: qty 10

## 2024-05-05 NOTE — Patient Instructions (Signed)

## 2024-05-12 ENCOUNTER — Inpatient Hospital Stay

## 2024-05-12 VITALS — BP 160/78 | HR 68 | Temp 97.2°F | Resp 18

## 2024-05-12 DIAGNOSIS — D508 Other iron deficiency anemias: Secondary | ICD-10-CM

## 2024-05-12 DIAGNOSIS — D509 Iron deficiency anemia, unspecified: Secondary | ICD-10-CM | POA: Diagnosis not present

## 2024-05-12 MED ORDER — IRON SUCROSE 20 MG/ML IV SOLN
200.0000 mg | INTRAVENOUS | Status: DC
  Administered 2024-05-12: 200 mg via INTRAVENOUS
  Filled 2024-05-12: qty 10

## 2024-05-12 NOTE — Patient Instructions (Signed)

## 2024-05-13 NOTE — Progress Notes (Unsigned)
 05/14/2024 3:37 PM   Monica Hess 13-Apr-1941 [id number]  Referring provider: Rudolpho Norleen BIRCH, MD [address] [facility] [address] [facility] [place],  [place] [address]  Urological history: 1. rUTI's -CT (12/2022) - no nidus for infections - December 31, 2023, E. coli and beta-hemolytic Streptococcus, group B -Trimethoprim  100 mg daily  Chief Complaint  Patient presents with   Other   HPI: Monica Hess is a 83 y.o. female who presents today for 1 month follow-up for labial fusion.  Previous records reviewed.   At her visit on April 02, 2024, she was having worsening symptoms of nocturia, urgency and incontinence.  She states she also was feeling some swelling in her vaginal area.  On pelvic exam, it was discovered that she had diffuse labia and therefore her urine was being trapped in the vaginal introitus.  This was causing some incontinence as well as the swelling in her vagina.  She was placed on vaginal estrogen cream and instructed to follow-up in 1 month.     She has been using the vaginal estrogen cream and states that she feels better.  She still has urinary leakage.  Patient denies any modifying or aggravating factors.  Patient denies any recent UTI's, gross hematuria, dysuria or suprapubic/flank pain.  Patient denies any fevers, chills, nausea or vomiting.    Her hemoglobin A1c in July was 7.1  PMH: Past Medical History:  Diagnosis Date   Back pain    Depression    Diabetes mellitus without complication (HCC)    GERD (gastroesophageal reflux disease)    Hypercholesterolemia    Hypertension     Surgical History: Past Surgical History:  Procedure Laterality Date   APPENDECTOMY     COLONOSCOPY     COLONOSCOPY WITH PROPOFOL  N/A 07/25/2015   Procedure: COLONOSCOPY WITH PROPOFOL ;  Surgeon: Lamar ONEIDA Holmes, MD;  Location: [facility] ENDOSCOPY;  Service: Endoscopy;  Laterality: N/A;   ESOPHAGOGASTRODUODENOSCOPY     ESOPHAGOGASTRODUODENOSCOPY (EGD) WITH PROPOFOL  N/A 07/25/2015    Procedure: ESOPHAGOGASTRODUODENOSCOPY (EGD) WITH PROPOFOL ;  Surgeon: Lamar ONEIDA Holmes, MD;  Location: [facility] ENDOSCOPY;  Service: Endoscopy;  Laterality: N/A;   EYE SURGERY      Home Medications:  Allergies as of 05/14/2024       Reactions   Codeine Sulfate    Lipitor [atorvastatin]    Nitrofurantoin    Vytorin [ezetimibe-simvastatin]         Medication List        Accurate as of May 14, 2024  3:37 PM. If you have any questions, ask your nurse or doctor.          amLODipine-valsartan 10-160 MG tablet Commonly known as: EXFORGE Take 1 tablet by mouth daily.   amoxicillin -clavulanate 875-125 MG tablet Commonly known as: AUGMENTIN  Take 1 tablet by mouth every 12 (twelve) hours.   Cavilon Durable Barrier 1.3 % Crea Generic drug: Dimethicone Use liberally to the perineum daily   celecoxib 200 MG capsule Commonly known as: CELEBREX   cholestyramine 4 g packet Commonly known as: QUESTRAN Take 4 g by mouth 3 (three) times daily with meals.   estrogens  (conjugated) 0.3 MG tablet Commonly known as: PREMARIN  Take 0.3 mg by mouth daily. Take daily for 21 days then do not take for 7 days.   Fifty50 Glucose Meter 2.0 w/Device Kit Freestyle Meter. Use as directed to check blood glucose Dx E11.65   gabapentin 100 MG capsule Commonly known as: NEURONTIN Take 1 capsule by mouth in the morning, at noon,  in the evening, and at bedtime.   glipiZIDE 5 MG tablet Commonly known as: GLUCOTROL Take by mouth daily before breakfast.   hydrALAZINE 25 MG tablet Commonly known as: APRESOLINE   metFORMIN 500 MG tablet Commonly known as: GLUCOPHAGE Take by mouth 2 (two) times daily with a meal.   omeprazole 20 MG capsule Commonly known as: PRILOSEC Take 20 mg by mouth daily.   pioglitazone 15 MG tablet Commonly known as: ACTOS Take 15 mg by mouth daily.   Premarin  vaginal cream Generic drug: conjugated estrogens  Start by applying 1.0 gram (1 inch strip on index finger) to  the fused labia every night until return for follow up.   trimethoprim  100 MG tablet Commonly known as: TRIMPEX  Take 1 tablet (100 mg total) by mouth daily.   Trospium  Chloride 60 MG Cp24 Take 1 capsule (60 mg total) by mouth daily.   Vitamin D (Ergocalciferol) 1.25 MG (50000 UNIT) Caps capsule Commonly known as: DRISDOL Take 50,000 Units by mouth once a week.        Allergies:  Allergies  Allergen Reactions   Codeine Sulfate    Lipitor [Atorvastatin]    Nitrofurantoin    Vytorin [Ezetimibe-Simvastatin]     Family History: Family History  Problem Relation Age of Onset   Breast cancer Neg Hx     Social History:  reports that she has quit smoking. She has been exposed to tobacco smoke. She does not have any smokeless tobacco history on file. She reports that she does not currently use alcohol. No history on file for drug use.  ROS: Pertinent ROS in HPI  Physical Exam: BP (!) 177/93   Pulse 71   Ht 5' 6 (1.676 m)   Wt 160 lb (72.6 kg)   BMI 25.82 kg/m   Constitutional:  Well nourished. Alert and oriented, No acute distress. HEENT: [place] AT, moist mucus membranes.  Trachea midline Cardiovascular: No clubbing, cyanosis, or edema. Respiratory: Normal respiratory effort, no increased work of breathing. GU: No CVA tenderness.  No bladder fullness or masses.  Labia are still completely fused with a small pinpoint hole where the urine is coming out.   Neurologic: Grossly intact, no focal deficits, moving all 4 extremities. Psychiatric: Normal mood and affect.    Laboratory Data: See epic and HPI I have reviewed the labs.  See HPI.       Pertinent Imaging: N/A  Assessment & Plan:    1. Labia fusion - Patient found to have labial fusion on exam with only a pinpoint opening where the urine is leaking out - I prescribed vaginal estrogen cream to apply 1 g daily for the next 2 to 3 weeks, but it was not effective in helping to spread the labia as they are still  fused - Will refer to OB/GYN for further evaluation and management - Explained that if she should have difficulty with voiding, she needs to reach out to us  as we will need to place a suprapubic tube to empty the bladder - Her PVRs fortunately have been minimal here in the office - Will have her follow-up in 3 months  2. rUTI's - Likely secondary to trapped urine in the vagina due to labial fusion  3. Urge incontinence - Likely secondary to trapped urine in the vagina due to labial fusion  Return in about 3 months (around 08/14/2024) for recheck .  These notes generated with voice recognition software. I apologize for typographical errors.  CLOTILDA HELON RIGGERS  [facility] [address]  [address] [place], [place] [address] [phone]

## 2024-05-14 ENCOUNTER — Encounter: Payer: Self-pay | Admitting: Urology

## 2024-05-14 ENCOUNTER — Ambulatory Visit: Admitting: Urology

## 2024-05-14 ENCOUNTER — Other Ambulatory Visit: Payer: Self-pay | Admitting: Surgery

## 2024-05-14 ENCOUNTER — Ambulatory Visit (INDEPENDENT_AMBULATORY_CARE_PROVIDER_SITE_OTHER): Admitting: Urology

## 2024-05-14 VITALS — BP 177/93 | HR 71 | Ht 66.0 in | Wt 160.0 lb

## 2024-05-14 DIAGNOSIS — K760 Fatty (change of) liver, not elsewhere classified: Secondary | ICD-10-CM | POA: Insufficient documentation

## 2024-05-14 DIAGNOSIS — Q525 Fusion of labia: Secondary | ICD-10-CM

## 2024-05-14 DIAGNOSIS — N390 Urinary tract infection, site not specified: Secondary | ICD-10-CM

## 2024-05-14 DIAGNOSIS — N3941 Urge incontinence: Secondary | ICD-10-CM

## 2024-05-14 DIAGNOSIS — N952 Postmenopausal atrophic vaginitis: Secondary | ICD-10-CM | POA: Diagnosis not present

## 2024-05-19 ENCOUNTER — Inpatient Hospital Stay

## 2024-05-19 VITALS — BP 152/89 | HR 59 | Temp 96.9°F | Resp 18

## 2024-05-19 DIAGNOSIS — D508 Other iron deficiency anemias: Secondary | ICD-10-CM

## 2024-05-19 DIAGNOSIS — D509 Iron deficiency anemia, unspecified: Secondary | ICD-10-CM | POA: Diagnosis not present

## 2024-05-19 MED ORDER — IRON SUCROSE 20 MG/ML IV SOLN
200.0000 mg | INTRAVENOUS | Status: DC
  Administered 2024-05-19: 200 mg via INTRAVENOUS
  Filled 2024-05-19: qty 10

## 2024-05-19 NOTE — Patient Instructions (Signed)

## 2024-05-19 NOTE — Progress Notes (Signed)
 Chief Complaint: Chief Complaint  Patient presents with  . Left Shoulder - Pain, Follow-up  . Left Wrist - Pre-op Exam, Pain    [facility] 05/27/2024 [place]    Monica Hess is a 83 y.o. female who presents today for presurgical history and physical for upcoming left endoscopic carpal tunnel release scheduled with Dr. Edie on 05/27/2024 in addition to evaluation of ongoing left shoulder pain.  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  The patient has not suffered any trauma or injury since her last evaluation.  She does continue to report burning and tingling in the left hand in addition to her right hand at today's appointment.  She denies any changes in her medical history since her last evaluation.  She denies any personal history of heart attack, stroke, asthma or COPD.  She is a diabetic, most recent A1c did return at 7.1.  The patient also reports ongoing left shoulder pain at today's visit.  The patient has been experiencing left shoulder pain for the past several months without any trauma or injury.  The patient was evaluated at the walk-in clinic where x-rays of the left shoulder did demonstrate evidence of potential slight high riding humerus indicative of a potential rotator cuff arthropathy.  No surgical history on the left shoulder.  She does report increased pain with attempted with the shoulder and arm above her head off that side.  She does report some increased discomfort when attempting to sleep on the left side at night.  No surgical history involving left shoulder.  She is right-hand dominant.  Past Medical History: Past Medical History:  Diagnosis Date  . Arthritis   . Cataract  cortical, senile   . Chronic low back pain   . Elevated LFTs   . Fatty liver disease, nonalcoholic   . History of chicken pox   . History of Helicobacter pylori infection    Treated with Prevpac in 2008    Past Surgical History: Past Surgical History:  Procedure Laterality Date  . EGD  05/30/2007  . COLONOSCOPY  06/29/2010   Adenomatous Polyp: CBF 06/2015; Recall Ltr mailed 05/09/2015 (dw)  . COLONOSCOPY  07/25/2015   PH Adenomatous Polyp: CBF 06/2020  . COLONOSCOPY  11/08/2020   Normal colon/PHx CP/No repeat due to age/TKT  . EGD  11/08/2020   Reflux esophagitis/No repeat/TKT  . APPENDECTOMY    . bladder tack    . CATARACT EXTRACTION      Past Family History: Family History  Problem Relation Age of Onset  . High blood pressure (Hypertension) Mother   . Allergic rhinitis Mother     Medications: Current Outpatient Medications  Medication Sig Dispense Refill  . amLODIPine-valsartan (EXFORGE) 10-160 mg tablet Take 1 tablet by mouth once daily 90 tablet 3  . blood glucose diagnostic test strip Freestyle test strips. Use once daily to check blood glucose Dx E11.65 100 each 2  . blood glucose meter kit Freestyle Meter. Use as directed to check blood glucose Dx E11.65 1 each 0  . celecoxib (CELEBREX) 200 MG capsule Take 1 capsule (200 mg total) by mouth 2 (two) times daily 180 capsule 1  . cholestyramine (QUESTRAN) 4 gram oral powder packet MIX AND DISSOLVE 1  PACKET IN 60-180 ML OF WATER, MILK OR JUICE THEN DRINK BY MOUTH EVERY MORNING BEFORE BREAKFAST 30 each 0  . ergocalciferol, vitamin D2, 1,250 mcg (50,000 unit) capsule Take 1 capsule (50,000 Units total) by mouth once a week for 12 doses Begin OTC VIT D after completion. 12 capsule 0  . estrogens , conjugated, (PREMARIN ) 0.3 MG tablet Take 1 tablet (0.3 mg total) by mouth once daily 90 tablet 3  . ferrous sulfate 325 (65 FE) MG tablet Take 1 tablet (325 mg total) by mouth daily with breakfast (Patient not taking: Reported on  05/15/2024) 90 tablet 1  . gabapentin (NEURONTIN) 100 MG capsule Take 2 capsules (200 mg total) by mouth 2 (two) times daily 360 capsule 1  . glipiZIDE (GLUCOTROL) 10 MG tablet Take 1 tablet (10 mg total) by mouth 2 (two) times daily before meals 180 tablet 1  . hydrALAZINE (APRESOLINE) 25 MG tablet Take 1 tablet (25 mg total) by mouth 2 (two) times daily 180 tablet 1  . hydroCHLOROthiazide (HYDRODIURIL) 25 MG tablet Take 1 tablet (25 mg total) by mouth once daily 90 tablet 1  . metaxalone (SKELAXIN) 800 mg tablet May take 1/2 tablet up to 3 times daily as needed for pain or spasm.  Caution.  May cause drowsiness. (Patient not taking: Reported on 05/15/2024) 15 tablet 0  . metFORMIN (GLUCOPHAGE-XR) 500 MG XR tablet TAKE 2 TABLETS IN THE MORNING AND 1 TABLET IN THE EVENING 270 tablet 3  . methylPREDNISolone (MEDROL DOSEPACK) 4 mg tablet Follow package directions. 21 tablet 0  . omeprazole (PRILOSEC) 20 MG DR capsule TAKE 1 CAPSULE(20 MG) BY MOUTH DAILY 90 capsule 1  . pioglitazone (ACTOS) 15 MG tablet Take 1 tablet (15 mg total) by mouth once daily 90 tablet 1  . predniSONE (DELTASONE) 10 MG tablet May take 1 tablet twice daily with food for 5 days, then 1 tablet once daily with food for 5 days. (Patient not taking: Reported on 05/15/2024) 15 tablet 0  . trimethoprim  100 mg tablet Take 100 mg by mouth once daily (Patient not taking: Reported on 05/15/2024)    . trospium  (SANCTURA  XR) 60 mg XR capsule Take 60 mg by mouth once daily     No current facility-administered medications for this visit.    Allergies: Allergies  Allergen Reactions  . Codeine Sulfate Vomiting  . Lipitor [Atorvastatin] Muscle Pain  . Nitrofurantoin Unknown  . Vytorin 10-10 [Ezetimibe-Simvastatin] Muscle Pain     Review of Systems:  A comprehensive 14 point ROS was performed, reviewed by me today, and the pertinent orthopaedic findings are documented in the HPI.   Exam: BP 128/68   Ht 167.6 cm (5' 6)   Wt 75.3 kg (166  lb)   BMI 26.79 kg/m  General/Constitutional: The patient appears to be well-nourished, well-developed, and in no acute distress. Neuro/Psych: Normal mood and affect, oriented to person, place and time. Eyes: Non-icteric.  Pupils are equal, round, and reactive to light, and exhibit synchronous movement. ENT: Unremarkable. Lymphatic: No palpable adenopathy. Respiratory: Lungs clear to auscultation, Normal chest excursion, No wheezes, and Non-labored breathing Cardiovascular: Regular rate and rhythm.  No murmurs. and No edema, swelling or tenderness, except as noted in detailed exam. Integumentary: No impressive skin lesions present, except as noted in detailed exam. Musculoskeletal: Unremarkable, except as noted in detailed exam.  Neck: Neck has full range of motion.  There is no tenderness to palpation.  Spurling's test is negative.    Skin examination of the left  shoulder demonstrates no open wound, erythema or ecchymosis.  The patient does have some mild tenderness to palpation of the left subacromial space and along the proximal bicep tendon.  The patient is able to fully forward flex with pain at the extremes of forward flexion, she is able to abduct close to 95 degrees.  With the left arm abducted 9 degrees she can tolerate external rotation close to 80 degrees, internal rotation 75 degrees.  Negative drop arm test left upper extremity.  Bilateral Upper Extremity: Normal shoulder contour.  Good active and passive range of motion and stability of the shoulder, elbow, and wrist.  Normal motion of the hand and digits.  No swelling, erythema, or ecchymosis is noted.  There is no triggering or locking of the digits noted.  No thenar atrophy is visualized.  Some thenar atrophy can be seen more so in the left hand vs. The right.  The patient has a positive Phalen's test.  The patient has a positive Tinel's test.  The patient has decreased pinprick and light touch sensation in the median nerve  distribution.  4/5 grip strength.  The patient has less than 2 second capillary refill with good skin warmth.  Normal radial and ulnar pulse is palpated.  Intact strength with 5th digit abduction/adduction strength.  Non tender over the ulnar aspect of the wrist and over the cubital tunnel bilaterally.  Tenderness with palpation over the median nerve bilaterally.  Neurologic: Sensory function is intact, except as noted above.  Motor strength is 5/5, except as noted above.  No tremor or clonus is present.  Good motor coordination is noted.    Imaging: A nerve conduction study of bilateral upper extremities was obtained on 03/16/2024.  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  Treatment options were discussed today with the patient. 2.  The patient is scheduled to undergo a left carpal tunnel release with Dr. Edie on 05/27/2024. 3.  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 At today's visit appear to be primarily involving the median nerve due to the distribution of the burning and tingling.  She does not have any elbow pain and she reports that the burning and tingling is primarily affecting the first 4 digits and seems to spare the fifth digit at this time. 4.  Given that it seems that the symptoms are primarily involving the median nerve we did discuss the risk and benefits of a endoscopic carpal tunnel release.  After discussion of the risk and benefits the patient would like to proceed with a left endoscopic carpal tunnel  release procedure to be performed with Dr. Edie in the future.  We did discuss that she may continue to experience some burning tingling in the hand given the chronic nature of the carpal tunnel syndrome and the fact that the nerve conduction study did demonstrate some evidence of ulnar neuropathy as well. 6.  This document will serve as a surgical history and physical for the patient.  She can contact the clinic if she has any questions, new symptoms develop or symptoms worsen.  Pertaining to the left shoulder  she was given a short Medrol Dosepak to begin taking at this time, we did discuss a left subacromial steroid injection in the future after she has undergone surgery. 7.  The patient will follow-up per standard postop protocol.  She can contact the clinic if she has any questions, new symptoms develop or symptoms worsen.   The procedure was discussed with the patient, as were the potential risks (including bleeding, infection, nerve and/or blood vessel injury, persistent or recurrent pain/numbness, failure of the release, progression of arthritis, need for further surgery, blood clots, strokes, heart attacks and/or arhythmias, pneumonia, etc.) and benefits.  The patient states her understanding and wishes to proceed.   This office visit took 45 minutes, of which >50% involved patient counseling/education.  Review of the [place] CSRS was performed in accordance of the [facility] prior to dispensing any controlled drugs.  This note was generated in part with voice recognition software and I apologize for any typographical errors that were not detected and corrected.  Monica Gustavo Level, PA-C, CAQ-OS [facility]

## 2024-05-20 ENCOUNTER — Encounter
Admission: RE | Admit: 2024-05-20 | Discharge: 2024-05-20 | Disposition: A | Discharge status: Home / Self Care | Source: Ambulatory Visit | Attending: Surgery | Admitting: Surgery

## 2024-05-20 ENCOUNTER — Other Ambulatory Visit: Payer: Self-pay

## 2024-05-20 DIAGNOSIS — Z01810 Encounter for preprocedural cardiovascular examination: Secondary | ICD-10-CM

## 2024-05-20 DIAGNOSIS — Z01812 Encounter for preprocedural laboratory examination: Secondary | ICD-10-CM

## 2024-05-20 DIAGNOSIS — I10 Essential (primary) hypertension: Secondary | ICD-10-CM

## 2024-05-20 DIAGNOSIS — Z01818 Encounter for other preprocedural examination: Secondary | ICD-10-CM

## 2024-05-20 DIAGNOSIS — E119 Type 2 diabetes mellitus without complications: Secondary | ICD-10-CM

## 2024-05-20 DIAGNOSIS — K760 Fatty (change of) liver, not elsewhere classified: Secondary | ICD-10-CM

## 2024-05-20 DIAGNOSIS — Z79899 Other long term (current) drug therapy: Secondary | ICD-10-CM

## 2024-05-20 DIAGNOSIS — G5602 Carpal tunnel syndrome, left upper limb: Secondary | ICD-10-CM

## 2024-05-20 HISTORY — DX: Fatty (change of) liver, not elsewhere classified: K76.0

## 2024-05-20 HISTORY — DX: Vitamin D deficiency, unspecified: E55.9

## 2024-05-20 HISTORY — DX: Type 2 diabetes mellitus without complications: E11.9

## 2024-05-20 HISTORY — DX: Other chronic pain: G89.29

## 2024-05-20 HISTORY — DX: Carpal tunnel syndrome, left upper limb: G56.02

## 2024-05-20 HISTORY — DX: Iron deficiency anemia, unspecified: D50.9

## 2024-05-20 HISTORY — DX: Other specified postprocedural states: Z98.890

## 2024-05-20 HISTORY — DX: Personal history of nicotine dependence: Z87.891

## 2024-05-20 HISTORY — DX: Thrombocytopenia, unspecified: D69.6

## 2024-05-20 HISTORY — DX: Nausea with vomiting, unspecified: R11.2

## 2024-05-20 NOTE — Patient Instructions (Addendum)
 Your procedure is scheduled on:05-27-24 Wednesday Report to the Registration Desk on the 1st floor of the [facility].Then proceed to the 2nd floor Surgery Desk To find out your arrival time, please call [phone] between 1PM - 3PM on:05-26-24 Tuesday If your arrival time is 6:00 am, do not arrive before that time as the [facility] entrance doors do not open until 6:00 am.  REMEMBER: Instructions that are not followed completely may result in serious medical risk, up to and including death; or upon the discretion of your surgeon and anesthesiologist your surgery may need to be rescheduled.  Do not eat food after midnight the night before surgery.  No gum chewing or hard candies.  You may however, drink Water up to 2 hours before you are scheduled to arrive for your surgery. Do not drink anything within 2 hours of your scheduled arrival time.  In addition, your doctor has ordered for you to drink the provided:  Gatorade G2 Drinking this carbohydrate drink up to two hours before surgery helps to reduce insulin resistance and improve patient outcomes. Please complete drinking 2 hours before scheduled arrival time.  One week prior to surgery:Stop NOW (05-20-24) Stop Anti-inflammatories (NSAIDS) such as Advil, Aleve, Ibuprofen, Motrin, Naproxen, Naprosyn and Aspirin based products such as Excedrin, Goody's Powder, BC Powder. You may continue your celecoxib (CELEBREX) up until the day prior to surgery Stop ANY OVER THE COUNTER supplements until after surgery (Vitamin D, Vitamin B12)  You may however, continue to take Tylenol if needed for pain up until the day of surgery.  Stop metFORMIN (GLUCOPHAGE-XR) 2 days prior to surgery-Last dose will be on 05-24-24 Sunday  Continue taking all of your other prescription medications up until the day of surgery.  ON THE DAY OF SURGERY ONLY TAKE THESE MEDICATIONS WITH SIPS OF WATER: -gabapentin (NEURONTIN)  -hydrALAZINE (APRESOLINE)  -omeprazole  (PRILOSEC)   No Alcohol for 24 hours before or after surgery.  No Smoking including e-cigarettes for 24 hours before surgery.  No chewable tobacco products for at least 6 hours before surgery.  No nicotine patches on the day of surgery.  Do not use any recreational drugs for at least a week (preferably 2 weeks) before your surgery.  Please be advised that the combination of cocaine and anesthesia may have negative outcomes, up to and including death. If you test positive for cocaine, your surgery will be cancelled.  On the morning of surgery brush your teeth with toothpaste and water, you may rinse your mouth with mouthwash if you wish. Do not swallow any toothpaste or mouthwash.  Use CHG Soap as directed on instruction sheet.  Do not wear jewelry, make-up, hairpins, clips or nail polish.  For welded (permanent) jewelry: bracelets, anklets, waist bands, etc.  Please have this removed prior to surgery.  If it is not removed, there is a chance that hospital personnel will need to cut it off on the day of surgery.  Do not wear lotions, powders, or perfumes.   Do not shave body hair from the neck down 48 hours before surgery.  Contact lenses, hearing aids and dentures may not be worn into surgery.  Do not bring valuables to the hospital. [facility] is not responsible for any missing/lost belongings or valuables.   Notify your doctor if there is any change in your medical condition (cold, fever, infection).  Wear comfortable clothing (specific to your surgery type) to the hospital.  After surgery, you can help prevent lung complications by doing  breathing exercises.  Take deep breaths and cough every 1-2 hours. Your doctor may order a device called an Incentive Spirometer to help you take deep breaths. When coughing or sneezing, hold a pillow firmly against your incision with both hands. This is called "splinting." Doing this helps protect your incision. It also decreases belly  discomfort.  If you are being admitted to the hospital overnight, leave your suitcase in the car. After surgery it may be brought to your room.  In case of increased patient census, it may be necessary for you, the patient, to continue your postoperative care in the Same Day Surgery department.  If you are being discharged the day of surgery, you will not be allowed to drive home. You will need a responsible individual to drive you home and stay with you for 24 hours after surgery.   If you are taking public transportation, you will need to have a responsible individual with you.  Please call the Pre-admissions Testing Dept. at [phone] if you have any questions about these instructions.  Surgery Visitation Policy:  Patients having surgery or a procedure may have two visitors.  Children under the age of 73 must have an adult with them who is not the patient.                                                                                                             Preparing for Surgery with CHLORHEXIDINE GLUCONATE (CHG) Soap  Chlorhexidine Gluconate (CHG) Soap  o An antiseptic cleaner that kills germs and bonds with the skin to continue killing germs even after washing  o Used for showering the night before surgery and morning of surgery  Before surgery, you can play an important role by reducing the number of germs on your skin.  CHG (Chlorhexidine gluconate) soap is an antiseptic cleanser which kills germs and bonds with the skin to continue killing germs even after washing.  Please do not use if you have an allergy to CHG or antibacterial soaps. If your skin becomes reddened/irritated stop using the CHG.  1. Shower the NIGHT BEFORE SURGERY and the MORNING OF SURGERY with CHG soap.  2. If you choose to wash your hair, wash your hair first as usual with your normal shampoo.  3. After shampooing, rinse your hair and body thoroughly to remove the shampoo.  4. Use CHG as  you would any other liquid soap. You can apply CHG directly to the skin and wash gently with a scrungie or a clean washcloth.  5. Apply the CHG soap to your body only from the neck down. Do not use on open wounds or open sores. Avoid contact with your eyes, ears, mouth, and genitals (private parts). Wash face and genitals (private parts) with your normal soap.  6. Wash thoroughly, paying special attention to the area where your surgery will be performed.  7. Thoroughly rinse your body with warm water.  8. Do not shower/wash with your normal soap after using and rinsing off the CHG soap.  9.  Pat yourself dry with a clean towel.  10. Wear clean pajamas to bed the night before surgery.  12. Place clean sheets on your bed the night of your first shower and do not sleep with pets.  13. Shower again with the CHG soap on the day of surgery prior to arriving at the hospital.  14. Do not apply any deodorants/lotions/powders.  15. Please wear clean clothes to the hospital.  [employer] to address health-related social needs:  [email or web address]

## 2024-05-21 ENCOUNTER — Other Ambulatory Visit: Payer: Self-pay

## 2024-05-21 ENCOUNTER — Encounter
Admission: RE | Admit: 2024-05-21 | Discharge: 2024-05-21 | Disposition: A | Discharge status: Home / Self Care | Source: Ambulatory Visit | Attending: Surgery | Admitting: Surgery

## 2024-05-21 DIAGNOSIS — I10 Essential (primary) hypertension: Secondary | ICD-10-CM | POA: Diagnosis not present

## 2024-05-21 DIAGNOSIS — Z01810 Encounter for preprocedural cardiovascular examination: Secondary | ICD-10-CM

## 2024-05-21 DIAGNOSIS — E119 Type 2 diabetes mellitus without complications: Secondary | ICD-10-CM | POA: Diagnosis not present

## 2024-05-21 DIAGNOSIS — Z01818 Encounter for other preprocedural examination: Secondary | ICD-10-CM | POA: Diagnosis present

## 2024-05-21 DIAGNOSIS — Z79899 Other long term (current) drug therapy: Secondary | ICD-10-CM

## 2024-05-21 DIAGNOSIS — Z01812 Encounter for preprocedural laboratory examination: Secondary | ICD-10-CM

## 2024-05-21 DIAGNOSIS — K760 Fatty (change of) liver, not elsewhere classified: Secondary | ICD-10-CM

## 2024-05-21 LAB — COMPREHENSIVE METABOLIC PANEL WITH GFR
ALT: 22 U/L (ref 0–44)
AST: 23 U/L (ref 15–41)
Albumin: 4.4 g/dL (ref 3.5–5.0)
Alkaline Phosphatase: 56 U/L (ref 38–126)
Anion gap: 10 (ref 5–15)
BUN: 37 mg/dL — ABNORMAL HIGH (ref 8–23)
CO2: 22 mmol/L (ref 22–32)
Calcium: 10.9 mg/dL — ABNORMAL HIGH (ref 8.9–10.3)
Chloride: 105 mmol/L (ref 98–111)
Creatinine, Ser: 0.75 mg/dL (ref 0.44–1.00)
GFR, Estimated: >60 mL/min (ref >60)
Glucose, Bld: 77 mg/dL (ref 70–99)
Potassium: 4.4 mmol/L (ref 3.5–5.1)
Sodium: 137 mmol/L (ref 135–145)
Total Bilirubin: 0.8 mg/dL (ref 0.0–1.2)
Total Protein: 8.5 g/dL — ABNORMAL HIGH (ref 6.5–8.1)

## 2024-05-26 ENCOUNTER — Inpatient Hospital Stay: Attending: Oncology

## 2024-05-26 VITALS — BP 132/53 | HR 57 | Temp 96.9°F | Resp 18

## 2024-05-26 DIAGNOSIS — E538 Deficiency of other specified B group vitamins: Secondary | ICD-10-CM | POA: Diagnosis not present

## 2024-05-26 DIAGNOSIS — D509 Iron deficiency anemia, unspecified: Secondary | ICD-10-CM | POA: Diagnosis present

## 2024-05-26 DIAGNOSIS — D508 Other iron deficiency anemias: Secondary | ICD-10-CM

## 2024-05-26 MED ORDER — IRON SUCROSE 20 MG/ML IV SOLN
200.0000 mg | INTRAVENOUS | Status: DC
  Administered 2024-05-26: 200 mg via INTRAVENOUS
  Filled 2024-05-26: qty 10

## 2024-05-26 MED ORDER — SODIUM CHLORIDE 0.9% FLUSH
10.0000 mL | Freq: Once | INTRAVENOUS | Status: AC | PRN
  Administered 2024-05-26: 10 mL
  Filled 2024-05-26: qty 10

## 2024-05-26 NOTE — Progress Notes (Signed)
 Patient declined to wait the 30 minutes for post iron infusion observation today. Tolerated infusion well. VSS.

## 2024-05-27 ENCOUNTER — Encounter
Admission: RE | Disposition: A | Discharge status: Home / Self Care | Payer: Self-pay | Source: Home / Self Care | Attending: Surgery

## 2024-05-27 ENCOUNTER — Encounter: Payer: Self-pay | Admitting: Surgery

## 2024-05-27 ENCOUNTER — Other Ambulatory Visit: Payer: Self-pay

## 2024-05-27 ENCOUNTER — Ambulatory Visit: Payer: Self-pay | Admitting: Urgent Care

## 2024-05-27 ENCOUNTER — Ambulatory Visit
Admission: RE | Admit: 2024-05-27 | Discharge: 2024-05-27 | Disposition: A | Discharge status: Home / Self Care | Attending: Surgery | Admitting: Surgery

## 2024-05-27 ENCOUNTER — Ambulatory Visit: Admitting: Certified Registered Nurse Anesthetist

## 2024-05-27 DIAGNOSIS — G5602 Carpal tunnel syndrome, left upper limb: Secondary | ICD-10-CM | POA: Insufficient documentation

## 2024-05-27 DIAGNOSIS — Z01818 Encounter for other preprocedural examination: Secondary | ICD-10-CM

## 2024-05-27 DIAGNOSIS — K219 Gastro-esophageal reflux disease without esophagitis: Secondary | ICD-10-CM | POA: Diagnosis not present

## 2024-05-27 DIAGNOSIS — I10 Essential (primary) hypertension: Secondary | ICD-10-CM | POA: Insufficient documentation

## 2024-05-27 DIAGNOSIS — D649 Anemia, unspecified: Secondary | ICD-10-CM | POA: Diagnosis not present

## 2024-05-27 DIAGNOSIS — Z87891 Personal history of nicotine dependence: Secondary | ICD-10-CM | POA: Diagnosis not present

## 2024-05-27 DIAGNOSIS — E119 Type 2 diabetes mellitus without complications: Secondary | ICD-10-CM | POA: Insufficient documentation

## 2024-05-27 DIAGNOSIS — Z7984 Long term (current) use of oral hypoglycemic drugs: Secondary | ICD-10-CM | POA: Insufficient documentation

## 2024-05-27 DIAGNOSIS — Z79899 Other long term (current) drug therapy: Secondary | ICD-10-CM | POA: Insufficient documentation

## 2024-05-27 DIAGNOSIS — K760 Fatty (change of) liver, not elsewhere classified: Secondary | ICD-10-CM | POA: Insufficient documentation

## 2024-05-27 HISTORY — PX: CARPAL TUNNEL RELEASE: SHX101

## 2024-05-27 LAB — GLUCOSE, CAPILLARY
Glucose-Capillary: 177 mg/dL — ABNORMAL HIGH (ref 70–99)
Glucose-Capillary: 199 mg/dL — ABNORMAL HIGH (ref 70–99)

## 2024-05-27 SURGERY — RELEASE, CARPAL TUNNEL, ENDOSCOPIC
Anesthesia: General | Site: Wrist | Laterality: Left

## 2024-05-27 MED ORDER — DEXAMETHASONE SODIUM PHOSPHATE 10 MG/ML IJ SOLN
INTRAMUSCULAR | Status: DC | PRN
  Administered 2024-05-27: 5 mg via INTRAVENOUS

## 2024-05-27 MED ORDER — OXYCODONE HCL 5 MG/5ML PO SOLN: 5.0000 mg | Freq: Once | ORAL | Status: DC | PRN

## 2024-05-27 MED ORDER — DEXAMETHASONE SODIUM PHOSPHATE 10 MG/ML IJ SOLN
INTRAMUSCULAR | Status: AC
  Filled 2024-05-27: qty 1

## 2024-05-27 MED ORDER — CEFAZOLIN SODIUM-DEXTROSE 2-4 GM/100ML-% IV SOLN
2.0000 g | INTRAVENOUS | Status: AC
  Administered 2024-05-27: 2 g via INTRAVENOUS

## 2024-05-27 MED ORDER — PROPOFOL 10 MG/ML IV BOLUS
INTRAVENOUS | Status: AC
Start: 2024-05-27 — End: 2024-05-27
  Filled 2024-05-27: qty 20

## 2024-05-27 MED ORDER — LIDOCAINE HCL (PF) 1 % IJ SOLN
INTRAMUSCULAR | Status: AC
  Filled 2024-05-27: qty 30

## 2024-05-27 MED ORDER — ACETAMINOPHEN 325 MG PO TABS: 325.0000 mg | ORAL_TABLET | Freq: Four times a day (QID) | ORAL | Status: DC | PRN

## 2024-05-27 MED ORDER — FENTANYL CITRATE (PF) 100 MCG/2ML IJ SOLN
INTRAMUSCULAR | Status: DC | PRN
  Administered 2024-05-27 (×2): 50 ug via INTRAVENOUS

## 2024-05-27 MED ORDER — CHLORHEXIDINE GLUCONATE 0.12 % MT SOLN
15.0000 mL | Freq: Once | OROMUCOSAL | Status: AC
  Administered 2024-05-27: 15 mL via OROMUCOSAL

## 2024-05-27 MED ORDER — CHLORHEXIDINE GLUCONATE 0.12 % MT SOLN
OROMUCOSAL | Status: AC
  Filled 2024-05-27: qty 15

## 2024-05-27 MED ORDER — SODIUM CHLORIDE 0.9 % IV SOLN: INTRAVENOUS | Status: DC

## 2024-05-27 MED ORDER — EPHEDRINE 5 MG/ML INJ
INTRAVENOUS | Status: AC
  Filled 2024-05-27: qty 5

## 2024-05-27 MED ORDER — 0.9 % SODIUM CHLORIDE (POUR BTL) OPTIME
TOPICAL | Status: DC | PRN
  Administered 2024-05-27: 500 mL

## 2024-05-27 MED ORDER — ONDANSETRON HCL 4 MG PO TABS: 4.0000 mg | ORAL_TABLET | Freq: Four times a day (QID) | ORAL | Status: DC | PRN

## 2024-05-27 MED ORDER — POTASSIUM CHLORIDE IN NACL 20-0.9 MEQ/L-% IV SOLN
INTRAVENOUS | Status: DC
  Filled 2024-05-27 (×3): qty 1000

## 2024-05-27 MED ORDER — LIDOCAINE HCL (PF) 2 % IJ SOLN
INTRAMUSCULAR | Status: AC
  Filled 2024-05-27: qty 5

## 2024-05-27 MED ORDER — ORAL CARE MOUTH RINSE: 15.0000 mL | Freq: Once | OROMUCOSAL | Status: AC

## 2024-05-27 MED ORDER — FENTANYL CITRATE (PF) 100 MCG/2ML IJ SOLN
INTRAMUSCULAR | Status: AC
  Filled 2024-05-27: qty 2

## 2024-05-27 MED ORDER — METOCLOPRAMIDE HCL 5 MG/ML IJ SOLN: 5.0000 mg | Freq: Three times a day (TID) | INTRAMUSCULAR | Status: DC | PRN

## 2024-05-27 MED ORDER — BUPIVACAINE HCL (PF) 0.5 % IJ SOLN
INTRAMUSCULAR | Status: AC
  Filled 2024-05-27: qty 30

## 2024-05-27 MED ORDER — BUPIVACAINE HCL (PF) 0.5 % IJ SOLN
INTRAMUSCULAR | Status: DC | PRN
  Administered 2024-05-27: 10 mL

## 2024-05-27 MED ORDER — EPHEDRINE SULFATE-NACL 50-0.9 MG/10ML-% IV SOSY
PREFILLED_SYRINGE | INTRAVENOUS | Status: DC | PRN
Start: 2024-05-27 — End: 2024-05-27
  Administered 2024-05-27: 10 mg via INTRAVENOUS

## 2024-05-27 MED ORDER — PROPOFOL 10 MG/ML IV BOLUS
INTRAVENOUS | Status: DC | PRN
  Administered 2024-05-27: 110 mg via INTRAVENOUS
  Administered 2024-05-27: 160 ug/kg/min via INTRAVENOUS

## 2024-05-27 MED ORDER — PROPOFOL 1000 MG/100ML IV EMUL
INTRAVENOUS | Status: AC
  Filled 2024-05-27: qty 100

## 2024-05-27 MED ORDER — METOCLOPRAMIDE HCL 10 MG PO TABS: 5.0000 mg | ORAL_TABLET | Freq: Three times a day (TID) | ORAL | Status: DC | PRN

## 2024-05-27 MED ORDER — ONDANSETRON HCL 4 MG/2ML IJ SOLN: 4.0000 mg | Freq: Four times a day (QID) | INTRAMUSCULAR | Status: DC | PRN

## 2024-05-27 MED ORDER — OXYCODONE HCL 5 MG PO TABS: 5.0000 mg | ORAL_TABLET | Freq: Once | ORAL | Status: DC | PRN

## 2024-05-27 MED ORDER — CEFAZOLIN SODIUM-DEXTROSE 2-4 GM/100ML-% IV SOLN
INTRAVENOUS | Status: AC
Start: 2024-05-27 — End: 2024-05-27
  Filled 2024-05-27: qty 100

## 2024-05-27 MED ORDER — ONDANSETRON HCL 4 MG/2ML IJ SOLN
INTRAMUSCULAR | Status: AC
Start: 2024-05-27 — End: 2024-05-27
  Filled 2024-05-27: qty 2

## 2024-05-27 MED ORDER — LIDOCAINE HCL (CARDIAC) PF 100 MG/5ML IV SOSY
PREFILLED_SYRINGE | INTRAVENOUS | Status: DC | PRN
  Administered 2024-05-27: 100 mg via INTRAVENOUS

## 2024-05-27 MED ORDER — ONDANSETRON HCL 4 MG/2ML IJ SOLN
INTRAMUSCULAR | Status: DC | PRN
  Administered 2024-05-27: 4 mg via INTRAVENOUS

## 2024-05-27 MED ORDER — FENTANYL CITRATE (PF) 100 MCG/2ML IJ SOLN: 25.0000 ug | INTRAMUSCULAR | Status: DC | PRN

## 2024-05-27 SURGICAL SUPPLY — 34 items
BNDG COHESIVE 4X5 TAN STRL LF (GAUZE/BANDAGES/DRESSINGS) ×1 IMPLANT
BNDG ELASTIC 2INX 5YD STR LF (GAUZE/BANDAGES/DRESSINGS) ×1 IMPLANT
BNDG ELASTIC 2X5.8 VLCR NS LF (GAUZE/BANDAGES/DRESSINGS) IMPLANT
BNDG ESMARCH 4X12 STRL LF (GAUZE/BANDAGES/DRESSINGS) ×1 IMPLANT
CHLORAPREP W/TINT 26 (MISCELLANEOUS) ×1 IMPLANT
CORD BIP STRL DISP 12FT (MISCELLANEOUS) ×1 IMPLANT
CUFF TOURN SGL QUICK 18X4 (TOURNIQUET CUFF) ×1 IMPLANT
DRAPE SURG 17X11 SM STRL (DRAPES) ×1 IMPLANT
DRSG XEROFORM 1X8 (GAUZE/BANDAGES/DRESSINGS) IMPLANT
FORCEPS JEWEL BIP 4-3/4 STR (INSTRUMENTS) ×1 IMPLANT
GAUZE SPONGE 4X4 12PLY STRL (GAUZE/BANDAGES/DRESSINGS) ×1 IMPLANT
GAUZE XEROFORM 1X8 LF (GAUZE/BANDAGES/DRESSINGS) ×1 IMPLANT
GLOVE BIO SURGEON STRL SZ8 (GLOVE) ×1 IMPLANT
GLOVE INDICATOR 8.0 STRL GRN (GLOVE) ×1 IMPLANT
GOWN STRL REUS W/ TWL LRG LVL3 (GOWN DISPOSABLE) ×1 IMPLANT
GOWN STRL REUS W/ TWL XL LVL3 (GOWN DISPOSABLE) ×1 IMPLANT
KIT ESCP INSRT D SLOT CANN KN (MISCELLANEOUS) ×1 IMPLANT
KIT TURNOVER KIT A (KITS) ×1 IMPLANT
MANIFOLD NEPTUNE II (INSTRUMENTS) ×1 IMPLANT
NS IRRIG 500ML POUR BTL (IV SOLUTION) ×1 IMPLANT
PACK EXTREMITY ARMC (MISCELLANEOUS) ×1 IMPLANT
PADDING CAST ABS COTTON 4X4 ST (CAST SUPPLIES) IMPLANT
PENCIL SMOKE EVACUATOR (MISCELLANEOUS) ×1 IMPLANT
SLING ARM M TX990204 (SOFTGOODS) IMPLANT
SPLINT WRIST LG LT TX990309 (SOFTGOODS) IMPLANT
SPLINT WRIST LG RT TX900304 (SOFTGOODS) IMPLANT
SPLINT WRIST M LT TX990308 (SOFTGOODS) IMPLANT
SPLINT WRIST M RT TX990303 (SOFTGOODS) IMPLANT
SPLINT WRIST XL LT TX990310 (SOFTGOODS) IMPLANT
SPLINT WRIST XL RT TX990305 (SOFTGOODS) IMPLANT
STOCKINETTE IMPERVIOUS 9X36 MD (GAUZE/BANDAGES/DRESSINGS) ×1 IMPLANT
SUT PROLENE 4 0 PS 2 18 (SUTURE) ×1 IMPLANT
TRAP FLUID SMOKE EVACUATOR (MISCELLANEOUS) IMPLANT
WATER STERILE IRR 500ML POUR (IV SOLUTION) IMPLANT

## 2024-05-27 NOTE — Discharge Instructions (Addendum)
 Orthopedic discharge instructions: Keep dressing dry and intact. Keep hand elevated above heart level. May shower after dressing removed on postop day 4 (Sunday). Cover sutures with Band-Aids after drying off, then reapply Velcro splint. Apply ice to affected area frequently. Resume Celebrex 200 mg p.o. twice daily OR take ibuprofen 600 mg TID with meals for 3-5 days, then as necessary. Take ES Tylenol  between ibuprofen doses if/when needed.  Return for follow-up in 10-14 days or as scheduled.

## 2024-05-27 NOTE — Anesthesia Preprocedure Evaluation (Addendum)
 Anesthesia Evaluation  Patient identified by MRN, date of birth, ID band Patient awake    Reviewed: Allergy & Precautions, NPO status , Patient's Chart, lab work & pertinent test results  History of Anesthesia Complications (+) PONV and history of anesthetic complications  Airway Mallampati: III  TM Distance: >3 FB Neck ROM: full    Dental no notable dental hx.    Pulmonary former smoker   Pulmonary exam normal        Cardiovascular hypertension, On Medications Normal cardiovascular exam     Neuro/Psych  PSYCHIATRIC DISORDERS  Depression     Neuromuscular disease    GI/Hepatic ,GERD  ,,NAFLD   Endo/Other  diabetes, Type 2, Oral Hypoglycemic Agents    Renal/GU      Musculoskeletal   Abdominal   Peds  Hematology  (+) Blood dyscrasia, anemia   Anesthesia Other Findings Past Medical History: No date: Back pain No date: Carpal tunnel syndrome of left wrist No date: Chronic low back pain No date: Depression No date: DM (diabetes mellitus), type 2 (HCC) No date: Former smoker No date: GERD (gastroesophageal reflux disease) No date: Hypercholesterolemia No date: Hypertension No date: IDA (iron  deficiency anemia) No date: NAFLD (nonalcoholic fatty liver disease) No date: PONV (postoperative nausea and vomiting) No date: Thrombocytopenia (HCC) No date: Vitamin D deficiency  Past Surgical History: No date: APPENDECTOMY No date: CATARACT EXTRACTION, BILATERAL No date: COLONOSCOPY 07/25/2015: COLONOSCOPY WITH PROPOFOL ; N/A     Comment:  Procedure: COLONOSCOPY WITH PROPOFOL ;  Surgeon: Lamar ONEIDA Holmes, MD;  [facility] ENDOSCOPY;  Service:               Endoscopy;  Laterality: N/A; No date: ESOPHAGOGASTRODUODENOSCOPY 07/25/2015: ESOPHAGOGASTRODUODENOSCOPY (EGD) WITH PROPOFOL ; N/A     Comment:  Procedure: ESOPHAGOGASTRODUODENOSCOPY (EGD) WITH               PROPOFOL ;  Surgeon: Lamar ONEIDA Holmes, MD;   Location: [facility]              ENDOSCOPY;  Service: Endoscopy;  Laterality: N/A;  BMI    Body Mass Index: 25.82 kg/m      Reproductive/Obstetrics negative OB ROS                              Anesthesia Physical Anesthesia Plan  ASA: 3  Anesthesia Plan: General LMA   Post-op Pain Management: Toradol IV (intra-op)* and Ofirmev  IV (intra-op)*   Induction: Intravenous  PONV Risk Score and Plan: 4 or greater and Dexamethasone , Ondansetron , Treatment may vary due to age or medical condition, TIVA and Propofol  infusion  Airway Management Planned: LMA  Additional Equipment:   Intra-op Plan:   Post-operative Plan: Extubation in OR  Informed Consent: I have reviewed the patients History and Physical, chart, labs and discussed the procedure including the risks, benefits and alternatives for the proposed anesthesia with the patient or authorized representative who has indicated his/her understanding and acceptance.     Dental Advisory Given  Plan Discussed with: Anesthesiologist, CRNA and Surgeon  Anesthesia Plan Comments: (Patient consented for risks of anesthesia including but not limited to:  - adverse reactions to medications - damage to eyes, teeth, lips or other oral mucosa - nerve damage due to positioning  - sore throat or hoarseness - Damage to heart, brain, nerves, lungs, other parts of body or loss of life  Patient voiced  understanding and assent.)         Anesthesia Quick Evaluation

## 2024-05-27 NOTE — H&P (Signed)
 History of Present Illness: Monica Hess is a 83 y.o. female who presents today for presurgical history and physical for upcoming left endoscopic carpal tunnel release scheduled with Dr. Edie on 05/27/2024 in addition to evaluation of ongoing left shoulder pain. 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 The patient has not suffered any trauma or injury since her last evaluation. She does continue to report burning and tingling in the left hand in addition to her right hand at today's appointment. She denies any changes in her medical history since her last evaluation. She denies any personal history of heart attack, stroke, asthma or COPD. She is a diabetic, most recent A1c did return at 7.1.  Past Medical History: Arthritis  Cataract cortical, senile  Chronic low back pain  Elevated LFTs  Fatty liver disease, nonalcoholic  History of chicken pox  History of Helicobacter pylori infection  Treated with Prevpac in 2008   Past Surgical History: EGD 05/30/2007  COLONOSCOPY 06/29/2010 (Adenomatous Polyp: CBF 06/2015)  COLONOSCOPY 07/25/2015 (PH Adenomatous Polyp: CBF 06/2020)  COLONOSCOPY 11/08/2020 (Normal colon/PHx CP/No repeat due to age/TKT)  EGD 11/08/2020 (Reflux esophagitis/No repeat/TKT)  APPENDECTOMY  bladder tack  CATARACT EXTRACTION   Past Family History: High blood pressure (Hypertension) Mother  Allergic rhinitis Mother   Medications: amLODIPine-valsartan (EXFORGE) 10-160 mg tablet Take 1 tablet by mouth once daily 90 tablet 3  blood glucose diagnostic test strip Freestyle test strips. Use once daily to check blood glucose Dx E11.65 100 each 2  blood glucose meter kit Freestyle  Meter. Use as directed to check blood glucose Dx E11.65 1 each 0  celecoxib (CELEBREX) 200 MG capsule Take 1 capsule (200 mg total) by mouth 2 (two) times daily 180 capsule 1  cholestyramine (QUESTRAN) 4 gram oral powder packet MIX AND DISSOLVE 1 PACKET IN 60-180 ML OF WATER, MILK OR JUICE THEN DRINK BY MOUTH EVERY MORNING BEFORE BREAKFAST 30 each 0  ergocalciferol, vitamin D2, 1,250 mcg (50,000 unit) capsule Take 1 capsule (50,000 Units total) by mouth once a week for 12 doses Begin OTC VIT D after completion. 12 capsule 0  estrogens , conjugated, (PREMARIN ) 0.3 MG tablet Take 1 tablet (0.3 mg total) by mouth once daily 90 tablet 3  ferrous sulfate 325 (65 FE) MG tablet Take 1 tablet (325 mg total) by mouth daily with breakfast (Patient not taking: Reported on 05/15/2024) 90 tablet 1  gabapentin (NEURONTIN) 100 MG capsule Take 2 capsules (200 mg total) by mouth 2 (two) times daily 360 capsule 1  glipiZIDE (GLUCOTROL) 10 MG tablet Take 1 tablet (10 mg total) by mouth 2 (two) times daily before meals 180 tablet 1  hydrALAZINE (APRESOLINE) 25 MG tablet Take 1 tablet (25 mg total) by mouth 2 (two) times daily 180 tablet 1  hydroCHLOROthiazide (HYDRODIURIL) 25 MG tablet Take 1 tablet (25 mg total) by mouth once daily 90 tablet 1  metaxalone (SKELAXIN) 800 mg tablet May take 1/2 tablet up to 3 times daily as needed for pain or spasm. Caution. May cause drowsiness. (Patient not taking: Reported on 05/15/2024) 15 tablet 0  metFORMIN (GLUCOPHAGE-XR) 500 MG XR tablet TAKE 2 TABLETS IN THE MORNING AND 1 TABLET IN THE EVENING 270 tablet 3  methylPREDNISolone (MEDROL DOSEPACK) 4  mg tablet Follow package directions. 21 tablet 0  omeprazole (PRILOSEC) 20 MG DR capsule TAKE 1 CAPSULE(20 MG) BY MOUTH DAILY 90 capsule 1  pioglitazone (ACTOS) 15 MG tablet Take 1 tablet (15 mg total) by mouth once daily 90 tablet 1  predniSONE (DELTASONE) 10 MG tablet May take 1 tablet twice daily with food for 5 days, then 1 tablet once  daily with food for 5 days. (Patient not taking: Reported on 05/15/2024) 15 tablet 0  trimethoprim  100 mg tablet Take 100 mg by mouth once daily (Patient not taking: Reported on 05/15/2024)  trospium  (SANCTURA  XR) 60 mg XR capsule Take 60 mg by mouth once daily   Allergies: Codeine Sulfate Vomiting  Lipitor [Atorvastatin] Muscle Pain  Nitrofurantoin Unknown  Vytorin 10-10 [Ezetimibe-Simvastatin] Muscle Pain   Review of Systems:  A comprehensive 14 point ROS was performed, reviewed by me today, and the pertinent orthopaedic findings are documented in the HPI.  Physical Exam: BP 128/68  Ht 167.6 cm (5' 6)  Wt 75.3 kg (166 lb)  BMI 26.79 kg/m  General/Constitutional: The patient appears to be well-nourished, well-developed, and in no acute distress. Neuro/Psych: Normal mood and affect, oriented to person, place and time. Eyes: Non-icteric. Pupils are equal, round, and reactive to light, and exhibit synchronous movement. ENT: Unremarkable. Lymphatic: No palpable adenopathy. Respiratory: Lungs clear to auscultation, Normal chest excursion, No wheezes, and Non-labored breathing Cardiovascular: Regular rate and rhythm. No murmurs. and No edema, swelling or tenderness, except as noted in detailed exam. Integumentary: No impressive skin lesions present, except as noted in detailed exam. Musculoskeletal: Unremarkable, except as noted in detailed exam.  Neck: Neck has full range of motion. There is no tenderness to palpation. Spurling's test is negative.   Skin examination of the left shoulder demonstrates no open wound, erythema or ecchymosis. The patient does have some mild tenderness to palpation of the left subacromial space and along the proximal bicep tendon. The patient is able to fully forward flex with pain at the extremes of forward flexion, she is able to abduct close to 95 degrees. With the left arm abducted 9 degrees she can tolerate external rotation close to 80 degrees, internal  rotation 75 degrees. Negative drop arm test left upper extremity.  Bilateral Upper Extremity: Normal shoulder contour. Good active and passive range of motion and stability of the shoulder, elbow, and wrist. Normal motion of the hand and digits. No swelling, erythema, or ecchymosis is noted. There is no triggering or locking of the digits noted. No thenar atrophy is visualized. Some thenar atrophy can be seen more so in the left hand vs. The right. The patient has a positive Phalen's test. The patient has a positive Tinel's test. The patient has decreased pinprick and light touch sensation in the median nerve distribution. 4/5 grip strength. The patient has less than 2 second capillary refill with good skin warmth. Normal radial and ulnar pulse is palpated. Intact strength with 5th digit abduction/adduction strength. Non tender over the ulnar aspect of the wrist and over the cubital tunnel bilaterally. Tenderness with palpation over the median nerve bilaterally.  Neurologic: Sensory function is intact, except as noted above. Motor strength is 5/5, except as noted above. No tremor or clonus is present. Good motor coordination is noted.   Imaging: A nerve conduction study of bilateral upper extremities was obtained on 03/16/2024. 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  Impression: 1. Carpal  tunnel syndrome of left wrist. 2. Type 2 diabetes mellitus with hyperglycemia, without long-term current use of insulin (CMS/HHS-HCC)  Plan:  1. Treatment options were discussed today with the patient. 2. The patient is scheduled to undergo a left carpal tunnel release with Dr. Edie on 05/27/2024. 3. 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 At today's visit appear to be primarily involving the median nerve  due to the distribution of the burning and tingling. She does not have any elbow pain and she reports that the burning and tingling is primarily affecting the first 4 digits and seems to spare the fifth digit at this time. 4. Given that it seems that the symptoms are primarily involving the median nerve we did discuss the risk and benefits of a endoscopic carpal tunnel release. After discussion of the risk and benefits the patient would like to proceed with a left endoscopic carpal tunnel release procedure to be performed with Dr. Edie in the future. We did discuss that she may continue to experience some burning tingling in the hand given the chronic nature of the carpal tunnel syndrome and the fact that the nerve conduction study did demonstrate some evidence of ulnar neuropathy as well. 6. This document will serve as a surgical history and physical for the patient. She can contact the clinic if she has any questions, new symptoms develop or symptoms worsen. Pertaining to the left shoulder she was given a short Medrol Dosepak to begin taking at this time, we did discuss a left subacromial steroid injection in the future after she has undergone surgery. 7. The patient will follow-up per standard postop protocol. She can contact the clinic if she has any questions, new symptoms develop or symptoms worsen.  The procedure was discussed with the patient, as were the potential risks (including bleeding, infection, nerve and/or blood vessel injury, persistent or recurrent pain/numbness, failure of the release, progression of arthritis, need for further surgery, blood clots, strokes, heart attacks and/or arhythmias, pneumonia, etc.) and benefits. The patient states her understanding and wishes to proceed.    H&P reviewed and patient re-examined. No changes.

## 2024-05-27 NOTE — Anesthesia Postprocedure Evaluation (Signed)
 Anesthesia Post Note  Patient: Monica Hess  Procedure(s) Performed: RELEASE, CARPAL TUNNEL, ENDOSCOPIC (Left: Wrist)  Patient location during evaluation: PACU Anesthesia Type: General Level of consciousness: awake and alert Pain management: pain level controlled Vital Signs Assessment: post-procedure vital signs reviewed and stable Respiratory status: spontaneous breathing, nonlabored ventilation, respiratory function stable and patient connected to nasal cannula oxygen Cardiovascular status: blood pressure returned to baseline and stable Postop Assessment: no apparent nausea or vomiting Anesthetic complications: no   No notable events documented.   Last Vitals:  Vitals:   05/27/24 1107 05/27/24 1115  BP: (!) 123/51 (!) 151/59  Pulse: 71 70  Resp: 14 15  Temp: (!) 36.1 C   SpO2: 96% 96%    Last Pain:  Vitals:   05/27/24 0929  TempSrc: Temporal  PainSc: 0-No pain                 Lendia LITTIE Mae

## 2024-05-27 NOTE — Anesthesia Procedure Notes (Signed)
 Procedure Name: LMA Insertion Date/Time: 05/27/2024 10:23 AM  Performed by: Jackye Spanner, CRNAPre-anesthesia Checklist: Patient identified, Patient being monitored, Timeout performed, Emergency Drugs available and Suction available Patient Re-evaluated:Patient Re-evaluated prior to induction Oxygen Delivery Method: Circle system utilized Preoxygenation: Pre-oxygenation with 100% oxygen Induction Type: IV induction Ventilation: Mask ventilation without difficulty LMA: LMA inserted LMA Size: 4.0 Tube type: Oral Number of attempts: 1 Placement Confirmation: positive ETCO2 and breath sounds checked- equal and bilateral Tube secured with: Tape Dental Injury: Teeth and Oropharynx as per pre-operative assessment  Comments: Smooth atraumatic LMA placement, no complications noted.

## 2024-05-27 NOTE — Op Note (Signed)
 05/27/2024  11:03 AM  Patient:   Monica Hess  Pre-Op Diagnosis:   Left carpal tunnel syndrome.  Post-Op Diagnosis:   Same.  Procedure:   Endoscopic left carpal tunnel release.  Surgeon:   DOROTHA Reyes Maltos, MD  Anesthesia:   General LMA  Findings:   As above.  Complications:   None  EBL:   0 cc  Fluids:   400 cc crystalloid  TT:   13 minutes at 250 mmHg  Drains:   None  Closure:   4-0 Prolene interrupted sutures  Brief Clinical Note:   The patient is an 83 year old female with a long history of progressively worsening pain and paresthesias to her left hand.  Her symptoms have progressed despite medications, activity modification, etc.  Her history and examination consistent with carpal tunnel syndrome confirmed by EMG.  The patient presents at this time for an endoscopic left carpal tunnel release.   Procedure:   The patient was brought into the operating room and lain in the supine position. After adequate general laryngeal mask anesthesia was obtained, the left hand and upper extremity were prepped with ChloraPrep solution before being draped sterilely. Preoperative antibiotics were administered. A timeout was performed to verify the appropriate surgical site before the limb was exsanguinated with an Esmarch and the tourniquet inflated to 250 mmHg.   An approximately 1.5-2 cm incision was made over the volar wrist flexion crease, centered over the palmaris longus tendon. The incision was carried down through the subcutaneous tissues with care taken to identify and protect any neurovascular structures. The distal forearm fascia was penetrated just proximal to the transverse carpal ligament. The soft tissues were released off the superficial and deep surfaces of the distal forearm fascia and this was released proximally for 3-4 cm under direct visualization.  Attention was directed distally. The Therapist, nutritional was passed beneath the transverse carpal ligament along the ulnar aspect  of the carpal tunnel and used to release any adhesions as well as to remove any adherent synovial tissue before first the smaller then the larger of the two dilators were passed beneath the transverse carpal ligament along the ulnar margin of the carpal tunnel. The slotted cannula was introduced and the endoscope was placed into the slotted cannula and the undersurface of the transverse carpal ligament visualized. The distal margin of the transverse carpal ligament was marked by placing a 25-gauge needle percutaneously at Kaplan's cardinal point so that it entered the distal portion of the slotted cannula. Under endoscopic visualization, the transverse carpal ligament was released from proximal to distal using the end-cutting blade. A second pass was performed to ensure complete release of the ligament. The adequacy of release was verified both endoscopically and by palpation using the freer elevator.  The wound was irrigated thoroughly with sterile saline solution before being closed using 4-0 Prolene interrupted sutures. A total of 10 cc of 0.5% plain Sensorcaine  was injected in and around the incision before a sterile bulky dressing was applied to the wound. The patient was placed into a volar wrist splint before being awakened, extubated, and returned to the recovery room in satisfactory condition after tolerating the procedure well.

## 2024-05-27 NOTE — Transfer of Care (Signed)
 Immediate Anesthesia Transfer of Care Note  Patient: Monica Hess  Procedure(s) Performed: RELEASE, CARPAL TUNNEL, ENDOSCOPIC (Left: Wrist)  Patient Location: PACU  Anesthesia Type:General  Level of Consciousness: awake, alert , and drowsy  Airway & Oxygen Therapy: Patient Spontanous Breathing and Patient connected to face mask oxygen  Post-op Assessment: Report given to RN and Post -op Vital signs reviewed and stable  Post vital signs: Reviewed and stable  Last Vitals:  Vitals Value Taken Time  BP 123/51 05/27/24 11:07  Temp 36.1 C 05/27/24 11:07  Pulse 70 05/27/24 11:11  Resp 14 05/27/24 11:11  SpO2 97 % 05/27/24 11:11  Vitals shown include unfiled device data.  Last Pain:  Vitals:   05/27/24 0929  TempSrc: Temporal  PainSc: 0-No pain         Complications: No notable events documented.

## 2024-05-28 ENCOUNTER — Encounter: Payer: Self-pay | Admitting: Surgery

## 2024-06-02 ENCOUNTER — Inpatient Hospital Stay

## 2024-06-02 VITALS — BP 146/56 | HR 72 | Temp 96.2°F | Resp 16

## 2024-06-02 DIAGNOSIS — D508 Other iron deficiency anemias: Secondary | ICD-10-CM

## 2024-06-02 DIAGNOSIS — D509 Iron deficiency anemia, unspecified: Secondary | ICD-10-CM | POA: Diagnosis not present

## 2024-06-02 MED ORDER — SODIUM CHLORIDE 0.9% FLUSH
10.0000 mL | Freq: Once | INTRAVENOUS | Status: AC | PRN
  Administered 2024-06-02: 10 mL
  Filled 2024-06-02: qty 10

## 2024-06-02 MED ORDER — IRON SUCROSE 20 MG/ML IV SOLN
200.0000 mg | INTRAVENOUS | Status: DC
  Administered 2024-06-02: 200 mg via INTRAVENOUS
  Filled 2024-06-02: qty 10

## 2024-06-11 ENCOUNTER — Telehealth: Payer: Self-pay | Admitting: *Deleted

## 2024-06-11 NOTE — Telephone Encounter (Signed)
 Yes ok to cancel our labs and go to [facility] for labs

## 2024-06-11 NOTE — Telephone Encounter (Signed)
 Patient has labs on 11 4 here at the cancer center as well as having it over at [place] clinic.  He is wanting to have just 1 stick at that time and not have to come to 2 different places on the same day.  I think it by call over to [facility] clinic I can just give them the CBC ,ferritin and Iibc and see if [facility] clinic could draw a CBC ferritin and I am to say that the person from coming to 2 places and getting stuck 2 times on the same day

## 2024-06-12 ENCOUNTER — Telehealth: Payer: Self-pay | Admitting: *Deleted

## 2024-06-12 NOTE — Telephone Encounter (Signed)
 I called today to say that I talked to the people at [facility] clinic with Dr. Rudolpho because patient has labs on 11/ 4 here at the cancer center as well as on [facility] clinic 11/4 .daughter's husband had called about it and I faxed the information for everything to go over to Dr. Rudolpho and they would get the labs for both of us  .  I was told that if she has any issues she will give me a callback.  I will be checking up on it because if it is going to work then I will be canceling the lab on our side since it is going to be all the way everything on [place] clinic, daughter is happy with that

## 2024-06-12 NOTE — Telephone Encounter (Signed)
 After speaking to the family  the patient has labs on 11/4 for cancer center as well she has labs at [facility] on same day 11/4.  Family would request that all the labs be done at [facility] clinic.  Told him that I can send a fax with the labs that we need to do from here at the cancer center and we will just look at the labs when they come back .  I got a fax number to take the paper and fax it to her Endo clinic [phone].  In the [place] clinic for her is Dr. Rudolpho.

## 2024-07-09 ENCOUNTER — Encounter: Admitting: Obstetrics and Gynecology

## 2024-07-24 ENCOUNTER — Encounter: Payer: Self-pay | Admitting: Oncology

## 2024-07-28 ENCOUNTER — Inpatient Hospital Stay

## 2024-07-28 ENCOUNTER — Encounter: Payer: Self-pay | Admitting: Oncology

## 2024-07-28 ENCOUNTER — Inpatient Hospital Stay: Attending: Oncology | Admitting: Oncology

## 2024-07-28 VITALS — BP 150/64 | HR 62 | Temp 97.1°F | Resp 18 | Ht 66.0 in | Wt 169.0 lb

## 2024-07-28 DIAGNOSIS — D509 Iron deficiency anemia, unspecified: Secondary | ICD-10-CM | POA: Insufficient documentation

## 2024-07-28 DIAGNOSIS — Z87891 Personal history of nicotine dependence: Secondary | ICD-10-CM | POA: Insufficient documentation

## 2024-07-28 NOTE — Progress Notes (Signed)
 Hematology/Oncology Consult note [facility]  Telephone:([phone] [phone]  Patient Care Team: Rudolpho Norleen BIRCH, MD as PCP - General (Internal Medicine) Melanee Annah BROCKS, MD as Consulting Physician (Oncology)   Name of the patient: Monica Hess  [id number]  1941/04/04   Date of visit: 07/28/24  Diagnosis-iron  deficiency anemia  Chief complaint/ Reason for visit-routine follow-up of iron  deficiency anemia  Heme/Onc history: Patient is a 83 year old female with a past medical history significant for hypertension hyperlipidemia GERD type 2 diabetes who has been referred for microcytic anemia.  Labs from 11/07/2023 showed an H&H of 9.7/22.2 with an MCV of 79.  White count and platelets were normal.  We do not have any recent iron  studies for her.  Of note her hemoglobin has been between 9-10 over the last 3 years.  Patient has had prior colonoscopy within the last 3 years although I do not have those reports with me.  That was unremarkable per patient currently patient denies any blood loss in her stool or urine.  Denies any dark melanotic stools.  She has tried oral iron  in the past but that gives her constipation.  Denies any consistent use of NSAIDs or family history of colon cancer     Patient received IV iron  in March 2025.  Last EGD and colonoscopy was in 2016 by Dr. Viktoria which was unremarkable was in 2016 which showed short segment Barrett's esophagus colonoscopy showed internal hemorrhoids.  Repeat colonoscopy was recommended in 5 years.  Interval history-patient feels well presently and denies any complaints at this time.  Denies any blood loss in her stool or urine.  She has some baseline fatigue  ECOG PS- 2 Pain scale- 0   Review of systems- Review of Systems  Constitutional:  Positive for malaise/fatigue. Negative for chills, fever and weight loss.  HENT:  Negative for congestion, ear discharge and nosebleeds.   Eyes:  Negative for blurred  vision.  Respiratory:  Negative for cough, hemoptysis, sputum production, shortness of breath and wheezing.   Cardiovascular:  Negative for chest pain, palpitations, orthopnea and claudication.  Gastrointestinal:  Negative for abdominal pain, blood in stool, constipation, diarrhea, heartburn, melena, nausea and vomiting.  Genitourinary:  Negative for dysuria, flank pain, frequency, hematuria and urgency.  Musculoskeletal:  Negative for back pain, joint pain and myalgias.  Skin:  Negative for rash.  Neurological:  Negative for dizziness, tingling, focal weakness, seizures, weakness and headaches.  Endo/Heme/Allergies:  Does not bruise/bleed easily.  Psychiatric/Behavioral:  Negative for depression and suicidal ideas. The patient does not have insomnia.       Allergies  Allergen Reactions   Codeine Sulfate    Lipitor [Atorvastatin]    Nitrofurantoin    Vytorin [Ezetimibe-Simvastatin]      Past Medical History:  Diagnosis Date   Back pain    Carpal tunnel syndrome of left wrist    Chronic low back pain    Depression    DM (diabetes mellitus), type 2 (HCC)    Former smoker    GERD (gastroesophageal reflux disease)    Hypercholesterolemia    Hypertension    IDA (iron  deficiency anemia)    NAFLD (nonalcoholic fatty liver disease)    PONV (postoperative nausea and vomiting)    Thrombocytopenia    Vitamin D deficiency      Past Surgical History:  Procedure Laterality Date   APPENDECTOMY     CARPAL TUNNEL RELEASE Left 05/27/2024   Procedure: RELEASE, CARPAL TUNNEL, ENDOSCOPIC;  Surgeon: Edie Norleen PARAS, MD;  Location: [facility] ORS;  Service: Orthopedics;  Laterality: Left;   CATARACT EXTRACTION, BILATERAL     COLONOSCOPY     COLONOSCOPY WITH PROPOFOL  N/A 07/25/2015   Procedure: COLONOSCOPY WITH PROPOFOL ;  Surgeon: Lamar ONEIDA Holmes, MD;  Location: [facility] ENDOSCOPY;  Service: Endoscopy;  Laterality: N/A;   ESOPHAGOGASTRODUODENOSCOPY     ESOPHAGOGASTRODUODENOSCOPY (EGD) WITH PROPOFOL  N/A  07/25/2015   Procedure: ESOPHAGOGASTRODUODENOSCOPY (EGD) WITH PROPOFOL ;  Surgeon: Lamar ONEIDA Holmes, MD;  Location: [facility] ENDOSCOPY;  Service: Endoscopy;  Laterality: N/A;    Social History   Socioeconomic History   Marital status: Married    Spouse name: Not on file   Number of children: Not on file   Years of education: Not on file   Highest education level: Not on file  Occupational History   Not on file  Tobacco Use   Smoking status: Former    Types: Cigarettes    Passive exposure: Past   Smokeless tobacco: Not on file  Vaping Use   Vaping status: Never Used  Substance and Sexual Activity   Alcohol use: Not Currently   Drug use: Never   Sexual activity: Not on file  Other Topics Concern   Not on file  Social History Narrative   Not on file   Social Drivers of Health   Financial Resource Strain: Low Risk  (07/06/2024)   Received from [facility]   Overall Financial Resource Strain (CARDIA)    Difficulty of Paying Living Expenses: Not very hard  Food Insecurity: No Food Insecurity (07/06/2024)   Received from [facility]   Hunger Vital Sign    Within the past 12 months, you worried that your food would run out before you got the money to buy more.: Never true    Within the past 12 months, the food you bought just didn't last and you didn't have money to get more.: Never true  Transportation Needs: No Transportation Needs (07/06/2024)   Received from [facility]    In the past 12 months, has lack of transportation kept you from medical appointments or from getting medications?: No    Lack of Transportation (Non-Medical): No  Physical Activity: Not on file  Stress: Not on file  Social Connections: Not on file  Intimate Partner Violence: Not At Risk (11/27/2023)   Humiliation, Afraid, Rape, and Kick questionnaire    Fear of Current or Ex-Partner: No    Emotionally Abused: No    Physically  Abused: No    Sexually Abused: No    Family History  Problem Relation Age of Onset   Breast cancer Neg Hx      Current Outpatient Medications:    amLODipine-valsartan (EXFORGE) 10-160 MG tablet, Take 1 tablet by mouth every morning., Disp: , Rfl:    Blood Glucose Monitoring Suppl (FIFTY50 GLUCOSE METER 2.0) w/Device KIT, Freestyle Meter. Use as directed to check blood glucose Dx E11.65, Disp: , Rfl:    celecoxib (CELEBREX) 200 MG capsule, Take 200 mg by mouth 2 (two) times daily., Disp: , Rfl:    cholecalciferol (VITAMIN D3) 25 MCG (1000 UNIT) tablet, Take 1,000 Units by mouth daily., Disp: , Rfl:    cholestyramine (QUESTRAN) 4 G packet, Take 4 g by mouth at bedtime., Disp: , Rfl:    Cyanocobalamin  (B-12 PO), Take 1 tablet by mouth daily., Disp: , Rfl:    Dimethicone (CAVILON DURABLE BARRIER) 1.3 % CREA, Use  liberally to the perineum daily, Disp: 207 g, Rfl: 3   estrogens , conjugated, (PREMARIN ) 0.3 MG tablet, Take 0.3 mg by mouth daily. Take daily for 21 days then do not take for 7 days., Disp: , Rfl:    gabapentin (NEURONTIN) 100 MG capsule, Take 200 mg by mouth 2 (two) times daily., Disp: , Rfl:    glipiZIDE (GLUCOTROL) 10 MG tablet, Take 10 mg by mouth 2 (two) times daily before a meal., Disp: , Rfl:    hydrALAZINE (APRESOLINE) 25 MG tablet, Take 25 mg by mouth in the morning and at bedtime., Disp: , Rfl:    hydrochlorothiazide (HYDRODIURIL) 25 MG tablet, Take 25 mg by mouth daily., Disp: , Rfl:    metFORMIN (GLUCOPHAGE-XR) 500 MG 24 hr tablet, Take 500-1,000 mg by mouth See admin instructions. Take 500 mg by mouth in the morning and 1000 mg at night, Disp: , Rfl:    omeprazole (PRILOSEC) 20 MG capsule, Take 20 mg by mouth every morning., Disp: , Rfl:    pioglitazone (ACTOS) 15 MG tablet, Take 15 mg by mouth daily., Disp: , Rfl:   Physical exam: There were no vitals filed for this visit. Physical Exam Cardiovascular:     Rate and Rhythm: Normal rate and regular rhythm.     Heart  sounds: Normal heart sounds.  Pulmonary:     Effort: Pulmonary effort is normal.     Breath sounds: Normal breath sounds.  Skin:    General: Skin is warm and dry.  Neurological:     Mental Status: She is alert and oriented to person, place, and time.      I have personally reviewed labs listed below:    Latest Ref Rng & Units 05/21/2024    9:31 AM  CMP  Glucose 70 - 99 mg/dL 77   BUN 8 - 23 mg/dL 37   Creatinine 9.55 - 1.00 mg/dL 9.24   Sodium 864 - 854 mmol/L 137   Potassium 3.5 - 5.1 mmol/L 4.4   Chloride 98 - 111 mmol/L 105   CO2 22 - 32 mmol/L 22   Calcium 8.9 - 10.3 mg/dL 89.0   Total Protein 6.5 - 8.1 g/dL 8.5   Total Bilirubin 0.0 - 1.2 mg/dL 0.8   Alkaline Phos 38 - 126 U/L 56   AST 15 - 41 U/L 23   ALT 0 - 44 U/L 22       Latest Ref Rng & Units 04/28/2024   12:43 PM  CBC  WBC 4.0 - 10.5 K/uL 5.1   Hemoglobin 12.0 - 15.0 g/dL 89.3   Hematocrit 63.9 - 46.0 % 33.5   Platelets 150 - 400 K/uL 101     Assessment and plan- Patient is a 83 y.o. female here for a routine follow-up of iron  deficiency anemia  Patient states that she had labs today with [facility] clinic including iron  studies which were checked but I do not have the results of those tests presently.  Her last hemoglobin from August 2025 was 10.6 and at that time she was noted to have a low ferritin of 25.  She did receive IV iron  between August and September 2025.  B12 levels at that time were normal at 434.  We will await labs done today from [facility] and give her a call if she still needs IV iron .  CBC ferritin and iron  studies in 3 and 6 months and I will see her back in 6 months   Visit Diagnosis 1. Iron  deficiency  anemia, unspecified iron  deficiency anemia type      Dr. Annah Skene, MD, MPH [facility] [phone] 07/28/2024 10:18 AM

## 2024-07-28 NOTE — Progress Notes (Signed)
 Patient has no new or acute concerns at this time.

## 2024-07-31 ENCOUNTER — Encounter: Payer: Self-pay | Admitting: Obstetrics

## 2024-07-31 NOTE — Progress Notes (Signed)
 GYNECOLOGY PROGRESS NOTE  Subjective:  PCP: Rudolpho Norleen BIRCH, MD  Patient ID: Monica Hess, female    DOB: 29-Jun-1941, 83 y.o.   MRN: [id number]  HPI  Patient is a 83 y.o. G2P2 female who was referred by her urologist for for labial fusion.  She was seen by urology in July for worsening nocturia, urgency, incontinence and vaginal swelling and discovered on exam to have labial fusion with a pinpoint opening through which urine is leaking.  She was previously using oral premarin  but urologist also started on vaginal estrogen cream for this problem.  While the patient reported improvement in symptoms over 1.5 months, her urologist did not feel it had helped with the fusion and referred her to gynecology.  Patient reports she stopped using the cream and is still taking oral estrogen. S/p hysterectomy > 47yrs ago.   Of note, pt with PMH of HTN, did not take medication today. BP 166/66 and denies HA, vision changes, palpitations, dizziness/lightheadedness, or weakness.   OB History  Gravida Para Term Preterm AB Living  2 2      SAB IAB Ectopic Multiple Live Births          # Outcome Date GA Lbr Len/2nd Weight Sex Type Anes PTL Lv  2 Para           1 Para            Past Medical History:  Diagnosis Date   Back pain    Carpal tunnel syndrome of left wrist    Chronic low back pain    Depression    DM (diabetes mellitus), type 2 (HCC)    Former smoker    GERD (gastroesophageal reflux disease)    Hypercholesterolemia    Hypertension    IDA (iron  deficiency anemia)    NAFLD (nonalcoholic fatty liver disease)    PONV (postoperative nausea and vomiting)    Thrombocytopenia    Vitamin D deficiency    Patient Active Problem List   Diagnosis Date Noted   Vulvar candidiasis 08/05/2024   Labial fusion 08/05/2024   Vulvar dermatitis 08/05/2024   Vulvar lesion 08/05/2024   Carpal tunnel syndrome, left 05/27/2024   Fatty liver disease, nonalcoholic 05/14/2024   Iron  deficiency anemia  11/28/2023   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   Thrombocytopenia 08/23/2015   Chronic low back pain 04/14/2014   Diabetes (HCC) 04/14/2014   GERD (gastroesophageal reflux disease) 04/14/2014   Hypercalcemia 04/14/2014   Hypercholesterolemia 04/14/2014   Hypertension 04/14/2014   Vitamin D deficiency 04/14/2014   Past Surgical History:  Procedure Laterality Date   APPENDECTOMY     CARPAL TUNNEL RELEASE Left 05/27/2024   Procedure: RELEASE, CARPAL TUNNEL, ENDOSCOPIC;  Surgeon: Edie Norleen PARAS, MD;  Location: [facility] ORS;  Service: Orthopedics;  Laterality: Left;   CATARACT EXTRACTION, BILATERAL     COLONOSCOPY     COLONOSCOPY WITH PROPOFOL  N/A 07/25/2015   Procedure: COLONOSCOPY WITH PROPOFOL ;  Surgeon: Lamar ONEIDA Holmes, MD;  Location: [facility] ENDOSCOPY;  Service: Endoscopy;  Laterality: N/A;   ESOPHAGOGASTRODUODENOSCOPY     ESOPHAGOGASTRODUODENOSCOPY (EGD) WITH PROPOFOL  N/A 07/25/2015   Procedure: ESOPHAGOGASTRODUODENOSCOPY (EGD) WITH PROPOFOL ;  Surgeon: Lamar ONEIDA Holmes, MD;  Location: [facility] ENDOSCOPY;  Service: Endoscopy;  Laterality: N/A;   VAGINAL HYSTERECTOMY  06/20/1994   Family History  Problem Relation Age of Onset   Breast cancer Neg Hx    Social History   Socioeconomic History   Marital status: Married    Spouse name: Not on  file   Number of children: Not on file   Years of education: Not on file   Highest education level: Not on file  Occupational History   Not on file  Tobacco Use   Smoking status: Former    Types: Cigarettes    Passive exposure: Past   Smokeless tobacco: Not on file  Vaping Use   Vaping status: Never Used  Substance and Sexual Activity   Alcohol use: Not Currently   Drug use: Never   Sexual activity: Not on file  Other Topics Concern   Not on file  Social History Narrative   Not on file   Social Drivers of Health   Financial Resource Strain: Low Risk  (08/04/2024)   Received from [facility]   Overall Financial Resource Strain  (CARDIA)    Difficulty of Paying Living Expenses: Not hard at all  Food Insecurity: No Food Insecurity (08/04/2024)   Received from [facility]   Hunger Vital Sign    Within the past 12 months, you worried that your food would run out before you got the money to buy more.: Never true    Within the past 12 months, the food you bought just didn't last and you didn't have money to get more.: Never true  Transportation Needs: No Transportation Needs (08/04/2024)   Received from [facility] - Transportation    In the past 12 months, has lack of transportation kept you from medical appointments or from getting medications?: No    Lack of Transportation (Non-Medical): No  Physical Activity: Not on file  Stress: Not on file  Social Connections: Not on file  Intimate Partner Violence: Not At Risk (11/27/2023)   Humiliation, Afraid, Rape, and Kick questionnaire    Fear of Current or Ex-Partner: No    Emotionally Abused: No    Physically Abused: No    Sexually Abused: No   Current Outpatient Medications on File Prior to Visit  Medication Sig Dispense Refill   amLODipine-valsartan (EXFORGE) 10-160 MG tablet Take 1 tablet by mouth every morning.     Blood Glucose Monitoring Suppl (FIFTY50 GLUCOSE METER 2.0) w/Device KIT Freestyle Meter. Use as directed to check blood glucose Dx E11.65     cholecalciferol (VITAMIN D3) 25 MCG (1000 UNIT) tablet Take 1,000 Units by mouth daily.     cholestyramine (QUESTRAN) 4 G packet Take 4 g by mouth at bedtime.     Cyanocobalamin  (B-12 PO) Take 1 tablet by mouth daily.     estrogens , conjugated, (PREMARIN ) 0.3 MG tablet Take 0.3 mg by mouth daily. Take daily for 21 days then do not take for 7 days.     gabapentin (NEURONTIN) 100 MG capsule Take 200 mg by mouth 2 (two) times daily.     glipiZIDE (GLUCOTROL) 10 MG tablet Take 10 mg by mouth 2 (two) times daily before a meal.     hydrALAZINE (APRESOLINE) 25 MG tablet Take  25 mg by mouth in the morning and at bedtime.     metFORMIN (GLUCOPHAGE-XR) 500 MG 24 hr tablet Take 500-1,000 mg by mouth See admin instructions. Take 500 mg by mouth in the morning and 1000 mg at night     omeprazole (PRILOSEC) 20 MG capsule Take 20 mg by mouth every morning.     pioglitazone (ACTOS) 15 MG tablet Take 15 mg by mouth daily.     celecoxib (CELEBREX) 200 MG capsule Take 200 mg by mouth 2 (  two) times daily.     Dimethicone (CAVILON DURABLE BARRIER) 1.3 % CREA Use liberally to the perineum daily (Patient not taking: Reported on 08/05/2024) 207 g 3   hydrochlorothiazide (HYDRODIURIL) 25 MG tablet Take 25 mg by mouth daily. (Patient not taking: Reported on 08/05/2024)     No current facility-administered medications on file prior to visit.    Review of Systems Pertinent items are noted in HPI.   Objective:   Blood pressure (!) 166/66, pulse 75, weight 171 lb 8 oz (77.8 kg). Body mass index is 27.68 kg/m.  General appearance: alert, cooperative, and no distress Abdomen: soft, non-tender; bowel sounds normal; no masses,  no organomegaly Pelvic: external genitalia inflamed and erythematous, labia completely fused; white patches in inguinal folds and perineum; a 3-67mm white lesion on mid-labia, a vascular, purple lesion near clitoris. See images. Extremities: extremities normal, atraumatic, no cyanosis or edema Neurologic: Grossly normal   Procedure: Vulvar punch biopsies  Informed consent obtained. Risks, benefits, and alternatives discussed. No contraindications identified. Allergies reviewed. Site identified and marked.  Anesthesia: Local infiltration with 1% lidocaine  without epinephrine .  EBL: minimal  Complications: none  Technique: The vulva was prepped with antiseptic solution. Adequate anesthesia confirmed. Using a 4 mm punch biopsy instrument, a full-thickness specimen was obtained from the perineum, the white lesion on the mid-labia, and the clitoral region  showing purplish discoloration. Hemostasis achieved with direct pressure and application of topical silver nitrite. Suture placement was not required. Specimens sent to pathology.    Assessment/Plan:   1. Labial fusion   2. Vulvar dermatitis   3. Vulvar candidiasis   4. Vulvar lesion     83 y.o. G2P2 referred by urology for labial fusion, failed topical therapy. On exam, concerned for possible lichen sclerosus vs dermatitis from her constant exposure to damp urine/clothing. Three biopsies taken today. Also with evidence of vulvar candidiasis.  -Rx for Diflucan x 3 sent, do not feel pt would be able to manage with topicals -Urogyn referral for the fusion and possible surgical management -F/u 2wks for biopsy results and further treatment plan -Instructions provided regarding wound care, signs of infection, and activity restrictions (avoid intercourse, tampons, douching for 5-7 days). Advised to expect mild discomfort and spotting.  -HTN: advised to take medications once she is able  Total time was 50 minutes. That includes chart review before the visit, the actual patient visit, and time spent on documentation after the visit. Time excludes procedures, if any.    Estil Mangle, DO [facility]

## 2024-08-04 NOTE — Progress Notes (Signed)
 Chief Complaint:   Chief Complaint  Patient presents with  . Follow-up    Subjective:   Monica Hess is a 83 y.o. female in today for follow-up for chronic medical problems.  Current Outpatient Medications  Medication Sig Dispense Refill  . amLODIPine-valsartan (EXFORGE) 10-160 mg tablet Take 1 tablet by mouth once daily 90 tablet 3  . blood glucose diagnostic test strip Freestyle test strips. Use once daily to check blood glucose Dx E11.65 100 each 2  . blood glucose meter kit Freestyle Meter. Use as directed to check blood glucose Dx E11.65 1 each 0  . celecoxib (CELEBREX) 200 MG capsule Take 1 capsule (200 mg total) by mouth 2 (two) times daily 180 capsule 1  . cholestyramine (QUESTRAN) 4 gram oral powder packet DISSOLVE CONTENTS OF 1 PACKET IN 60 TO 180 ML OF WATER, MILK OR JUICE AND TAKE BY MOUTH EVERY MORNING BEFORE BREAKFAST 30 each 0  . estrogens , conjugated, (PREMARIN ) 0.3 MG tablet Take 1 tablet (0.3 mg total) by mouth once daily 90 tablet 3  . gabapentin (NEURONTIN) 100 MG capsule Take 2 capsules (200 mg total) by mouth 2 (two) times daily 360 capsule 1  . glipiZIDE (GLUCOTROL) 10 MG tablet TAKE 1 TABLET(10 MG) BY MOUTH TWICE DAILY BEFORE MEALS 180 tablet 1  . hydrALAZINE (APRESOLINE) 25 MG tablet TAKE 1 TABLET(25 MG) BY MOUTH TWICE DAILY 180 tablet 1  . hydroCHLOROthiazide (HYDRODIURIL) 25 MG tablet TAKE 1 TABLET(25 MG) BY MOUTH DAILY 90 tablet 1  . metaxalone (SKELAXIN) 800 mg tablet May take 1/2 tablet up to 3 times daily as needed for pain or spasm.  Caution.  May cause drowsiness. 15 tablet 0  . metFORMIN (GLUCOPHAGE-XR) 500 MG XR tablet TAKE 2 TABLETS IN THE MORNING AND 1 TABLET IN THE EVENING 270 tablet 3  . omeprazole (PRILOSEC) 20 MG DR capsule TAKE 1 CAPSULE(20 MG) BY MOUTH DAILY 90 capsule 1  . pioglitazone (ACTOS) 15 MG tablet TAKE 1 TABLET(15 MG) BY MOUTH DAILY 90 tablet 1  . predniSONE (DELTASONE) 10 MG tablet May take 1 tablet twice daily with food for 5 days, then 1  tablet once daily with food for 5 days. 15 tablet 0  . trimethoprim  100 mg tablet Take 100 mg by mouth once daily    . trospium  (SANCTURA  XR) 60 mg XR capsule Take 60 mg by mouth once daily    . ergocalciferol, vitamin D2, 1,250 mcg (50,000 unit) capsule Take 1 capsule (50,000 Units total) by mouth once a week for 12 doses Begin OTC VIT D after completion. 12 capsule 0   No current facility-administered medications for this visit.    Allergies as of 08/04/2024 - Reviewed 08/04/2024  Allergen Reaction Noted  . Codeine sulfate Vomiting   . Lipitor [atorvastatin] Muscle Pain   . Nitrofurantoin Unknown   . Vytorin 10-10 [ezetimibe-simvastatin] Muscle Pain     Past Medical History:  Diagnosis Date  . Arthritis   . Cataract cortical, senile   . Chronic low back pain   . Elevated LFTs   . Fatty liver disease, nonalcoholic   . History of chicken pox   . History of Helicobacter pylori infection    Treated with Prevpac in 2008    Past Surgical History:  Procedure Laterality Date  . EGD  05/30/2007  . COLONOSCOPY  06/29/2010   Adenomatous Polyp: CBF 06/2015; Recall Ltr mailed 05/09/2015 (dw)  . COLONOSCOPY  07/25/2015   PH Adenomatous Polyp: CBF 06/2020  . COLONOSCOPY  11/08/2020   Normal colon/PHx CP/No repeat due to age/TKT  . EGD  11/08/2020   Reflux esophagitis/No repeat/TKT  . APPENDECTOMY    . bladder tack    . CATARACT EXTRACTION       Family History  Problem Relation Name Age of Onset  . High blood pressure (Hypertension) Mother    . Allergic rhinitis Mother      Social History:  reports that she quit smoking about 18 years ago. Her smoking use included cigarettes. She started smoking about 43 years ago. She has a 12.5 pack-year smoking history. She has been exposed to tobacco smoke. She has never used smokeless tobacco. She reports that she does not drink alcohol and does not use drugs.  Results for orders placed or performed in visit on 07/28/24  Basic Metabolic  Panel (BMP)  Result Value Ref Range   Glucose 167 (H) 70 - 110 mg/dL   Sodium 861 863 - 854 mmol/L   Potassium 4.2 3.6 - 5.1 mmol/L   Chloride 105 97 - 109 mmol/L   Carbon Dioxide (CO2) 25.4 22.0 - 32.0 mmol/L   Calcium 10.0 8.7 - 10.3 mg/dL   Urea Nitrogen (BUN) 28 (H) 7 - 25 mg/dL   Creatinine 0.9 0.6 - 1.1 mg/dL   Glomerular Filtration Rate (eGFR) 64 >60 mL/min/1.73sq m   BUN/Crea Ratio 31.1 (H) 6.0 - 20.0   Anion Gap w/K 11.8 6.0 - 16.0  Hemoglobin A1C  Result Value Ref Range   Hemoglobin A1C 7.4 (H) 4.2 - 5.6 %   Average Blood Glucose (Calc) 166 mg/dL   Narrative   Normal Range:    4.2 - 5.6% Increased Risk:  5.7 - 6.4% Diabetes:        >= 6.5% Glycemic Control for adults with diabetes:  <7%    CBC w/auto Differential (5 Part)  Result Value Ref Range   WBC (White Blood Cell Count) 5.1 4.1 - 10.2 10^3/uL   RBC (Red Blood Cell Count) 3.78 (L) 4.04 - 5.48 10^6/uL   Hemoglobin 11.4 (L) 12.0 - 15.0 gm/dL   Hematocrit 64.4 64.9 - 47.0 %   MCV (Mean Corpuscular Volume) 93.9 80.0 - 100.0 fl   MCH (Mean Corpuscular Hemoglobin) 30.2 27.0 - 31.2 pg   MCHC (Mean Corpuscular Hemoglobin Concentration) 32.1 32.0 - 36.0 gm/dL   Platelet Count 82 (L) 150 - 450 10^3/uL   RDW-CV (Red Cell Distribution Width) 14.7 11.6 - 14.8 %   MPV (Mean Platelet Volume) 11.1 9.4 - 12.4 fl   Neutrophils 2.63 1.50 - 7.80 10^3/uL   Lymphocytes 1.87 1.00 - 3.60 10^3/uL   Monocytes 0.28 0.00 - 1.50 10^3/uL   Eosinophils 0.23 0.00 - 0.55 10^3/uL   Basophils 0.03 0.00 - 0.09 10^3/uL   Neutrophil % 51.7 32.0 - 70.0 %   Lymphocyte % 36.7 10.0 - 50.0 %   Monocyte % 5.5 4.0 - 13.0 %   Eosinophil % 4.5 1.0 - 5.0 %   Basophil% 0.6 0.0 - 2.0 %   Immature Granulocyte % 1.0 (H) <=0.7 %   Immature Granulocyte Count 0.05 <=0.06 10^3/L  Iron  Panel  Result Value Ref Range   Iron  84 28 - 170 ug/dL   Total Iron  Binding Capacity (TIBC) 346.9 261.0 - 478.0 ug/dL   Transferrin 752.1 796.9 - 362.0 mg/dL   % Saturation  24 %      ROS:  General: No fever, chills or recent illness. No change in weight Skin:   No skin lesions, growths,  masses, rashes, pruritus  HEENT: No change in vision or hearing. No pain or difficulty with swallowing Respiratory: No cough or shortness of breath CV:  No chest pain or palpitations GI:  No pain, dyspepsia or change in bowel habits GU:  No dysuria, frequency, or hesitancy MSK:  No joint pain or injury Neurological: No headaches, changes in mental status, loss of sensation or strength Endocrine:  No heat or cold intolerance, polydipsia, polyuria  Objective:   Body mass index is 27.41 kg/m.  BP (!) 140/80 (BP Location: Left upper arm, Patient Position: Sitting, BP Cuff Size: Adult)   Pulse 71   Resp 14   Ht 167.6 cm (5' 6)   Wt 77 kg (169 lb 12.8 oz)   SpO2 95%   BMI 27.41 kg/m   General: WD/WN female, in no acute distress HEENT: Pupils equal and round, EOMI. oral mucosa moist.  Oropharynx clear. Neck: supple, trachea midline; no thyromegaly Respiratory:clear to auscultation.  No dullness to percussion.  No use of accessory muscles. Cardiac:  Regular rate and rhythm without murmur, gallops, or rubs   Assessment/Plan:   Primary hypertension  (primary encounter diagnosis) Type 2 diabetes mellitus with hyperglycemia, without long-term current use of insulin (CMS/HHS-HCC) Hypercholesterolemia Iron  deficiency anemia, unspecified iron  deficiency anemia type Thrombocytopenia () Fatty liver disease, nonalcoholic Gastroesophageal reflux disease without esophagitis Statin myopathy Need for vaccination  Assessment and Plan  1.  Hypertension.  Continue current medication. 2.  Diabetes.  Hemoglobin A1c is 7.4. 3.  Hyperlipidemia.  Continue statin therapy. 4.  Iron  deficiency anemia.  She gets iron  infusions. 5.  Thrombocytopenia.  Platelets are normal. 6.  Fatty liver.  Stable. 7.  GERD.  Continue PPI as needed. 8.  Statin myopathy.  She is unable to take  statins.    Goals     . * Maintain health/healthy lifestyle (pt-stated)    . * Maintain health/healthy lifestyle (pt-stated)    . * Maintain health/healthy lifestyle (pt-stated)       Goals       Patient Stated   . * Maintain health/healthy lifestyle (pt-stated)    . * Maintain health/healthy lifestyle (pt-stated)           . * Maintain health/healthy lifestyle (pt-stated)       Goals       Patient Stated   . * Maintain health/healthy lifestyle (pt-stated)    . * Maintain health/healthy lifestyle (pt-stated)    . * Maintain health/healthy lifestyle (pt-stated)       Goals       Patient Stated   . * Maintain health/healthy lifestyle (pt-stated)    . * Maintain health/healthy lifestyle (pt-stated)                      NORLEEN ALM ROWER, MD  Portions of this note were created using dictation software and may contain typographical errors.  *Some images could not be shown.

## 2024-08-05 ENCOUNTER — Ambulatory Visit (INDEPENDENT_AMBULATORY_CARE_PROVIDER_SITE_OTHER): Admitting: Obstetrics

## 2024-08-05 ENCOUNTER — Encounter: Payer: Self-pay | Admitting: Obstetrics

## 2024-08-05 ENCOUNTER — Other Ambulatory Visit (HOSPITAL_COMMUNITY)
Admission: RE | Admit: 2024-08-05 | Discharge: 2024-08-05 | Disposition: A | Discharge status: Home / Self Care | Source: Ambulatory Visit | Attending: Obstetrics | Admitting: Obstetrics

## 2024-08-05 VITALS — BP 166/66 | HR 75 | Wt 171.5 lb

## 2024-08-05 DIAGNOSIS — Q525 Fusion of labia: Secondary | ICD-10-CM

## 2024-08-05 DIAGNOSIS — N9089 Other specified noninflammatory disorders of vulva and perineum: Secondary | ICD-10-CM | POA: Insufficient documentation

## 2024-08-05 DIAGNOSIS — B3731 Acute candidiasis of vulva and vagina: Secondary | ICD-10-CM

## 2024-08-05 DIAGNOSIS — L309 Dermatitis, unspecified: Secondary | ICD-10-CM | POA: Diagnosis present

## 2024-08-05 DIAGNOSIS — N763 Subacute and chronic vulvitis: Secondary | ICD-10-CM | POA: Insufficient documentation

## 2024-08-05 DIAGNOSIS — N762 Acute vulvitis: Secondary | ICD-10-CM

## 2024-08-05 DIAGNOSIS — I10 Essential (primary) hypertension: Secondary | ICD-10-CM | POA: Diagnosis not present

## 2024-08-05 MED ORDER — FLUCONAZOLE 150 MG PO TABS: 150.0000 mg | ORAL_TABLET | ORAL | 0 refills | Status: AC

## 2024-08-10 ENCOUNTER — Ambulatory Visit: Payer: Self-pay | Admitting: Obstetrics

## 2024-08-10 LAB — SURGICAL PATHOLOGY

## 2024-08-13 ENCOUNTER — Telehealth: Payer: Self-pay

## 2024-08-13 NOTE — Telephone Encounter (Signed)
 Patient's son called because we referred this patient to Urogynecology at [facility], but it is not in network with her insurance [employer]).  He is requesting a referral to a different urogynecologist.  Referral coordinator contacted and she will re-refer to a different clinic.

## 2024-08-14 ENCOUNTER — Ambulatory Visit: Admitting: Urology

## 2024-08-17 NOTE — Progress Notes (Signed)
    GYNECOLOGY PROGRESS NOTE  Subjective:  PCP: Rudolpho Norleen BIRCH, MD  Patient ID: [name], female    DOB: April 23, 1941, 83 y.o.   MRN: [id number]  HPI  Patient is a 83 y.o. G2P2 female who presents with her son-in-law for biopsy results and treatment plan.  Patient was seen 08/05/24 for labial fusion and referred to urogyn, has upcoming appt in March at [facility]; on initial exam there was concern for lichen sclerosis, biopsy performed and is here for results. Did complete the Dilfucan x 3 and feels the itching has improved, but is still sore and using a donut cushion to sit.   The following portions of the patient's history were reviewed and updated as appropriate: allergies, current medications, past family history, past medical history, past social history, past surgical history, and problem list.  Review of Systems Pertinent items are noted in HPI.   Objective:   Blood pressure (!) 140/80, height 5' 6 (1.676 m), weight 162 lb (73.5 kg). Body mass index is 26.15 kg/m.  General appearance: alert and cooperative Abdomen: soft, non-tender; bowel sounds normal; no masses,  no organomegaly Pelvic: deferred Extremities: extremities normal, atraumatic, no cyanosis or edema Neurologic: Grossly normal  Pathology 08/05/24 FINAL MICROSCOPIC DIAGNOSIS:   A. PERINEUM, BIOPSY:  Lichenoid vulvitis.  See comment.   B. LABIAL, BIOPSY:  Lichenoid vulvitis.  See comment.   C. 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  See comment.   COMMENT:  1-3. The differential diagnosis includes lichen planus, fungus and a  lichenoid drug reaction.  PAS stain is pending and will be reported as  an addendum.   ADDENDUM:  A-C: PAS stain is performed on all three biopsies and no  fungi are identified.   Assessment/Plan:   1. Chronic vulvitis   83 y.o. G2P2 with almost complete labial fusion, suspicious for underlying lichen  sclerosus, pathology showing lichenoid vulvitis.  -We reviewed that treatment would be the same as lichen sclerosus -Rx for Clobetasol BID x 4wks, then taper based on symptoms.   -S/p Diflucan  x 3 from prior visit and improved her itching -Keep visit with Urogyn in march for surgical consult for fusion -F/u prn  Total time was 35 minutes. That includes chart review before the visit, the actual patient visit, and time spent on documentation after the visit. Time excludes procedures, if any.    Estil Mangle, DO [facility]

## 2024-08-26 ENCOUNTER — Ambulatory Visit: Admitting: Obstetrics

## 2024-08-26 ENCOUNTER — Encounter: Payer: Self-pay | Admitting: Obstetrics

## 2024-08-26 VITALS — BP 140/80 | Ht 66.0 in | Wt 162.0 lb

## 2024-08-26 DIAGNOSIS — N763 Subacute and chronic vulvitis: Secondary | ICD-10-CM

## 2024-08-26 MED ORDER — CLOBETASOL PROPIONATE 0.05 % EX OINT: 1.0000 | TOPICAL_OINTMENT | Freq: Two times a day (BID) | CUTANEOUS | 2 refills | Status: AC

## 2024-10-21 ENCOUNTER — Encounter: Payer: Self-pay | Admitting: Oncology

## 2024-10-28 ENCOUNTER — Inpatient Hospital Stay: Attending: Oncology

## 2024-10-28 DIAGNOSIS — D508 Other iron deficiency anemias: Secondary | ICD-10-CM

## 2024-10-28 LAB — CBC WITH DIFFERENTIAL (CANCER CENTER ONLY)
Abs Immature Granulocytes: 0.05 10*3/uL (ref 0.00–0.07)
Basophils Absolute: 0 10*3/uL (ref 0.0–0.1)
Basophils Relative: 0 %
Eosinophils Absolute: 0.2 10*3/uL (ref 0.0–0.5)
Eosinophils Relative: 4 %
HCT: 36.1 % (ref 36.0–46.0)
Hemoglobin: 11.5 g/dL — ABNORMAL LOW (ref 12.0–15.0)
Immature Granulocytes: 1 %
Lymphocytes Relative: 40 %
Lymphs Abs: 1.9 10*3/uL (ref 0.7–4.0)
MCH: 29.1 pg (ref 26.0–34.0)
MCHC: 31.9 g/dL (ref 30.0–36.0)
MCV: 91.4 fL (ref 80.0–100.0)
Monocytes Absolute: 0.3 10*3/uL (ref 0.1–1.0)
Monocytes Relative: 7 %
Neutro Abs: 2.3 10*3/uL (ref 1.7–7.7)
Neutrophils Relative %: 48 %
Platelet Count: 76 10*3/uL — ABNORMAL LOW (ref 150–400)
RBC: 3.95 MIL/uL (ref 3.87–5.11)
RDW: 14.2 % (ref 11.5–15.5)
WBC Count: 4.7 10*3/uL (ref 4.0–10.5)
nRBC: 0 % (ref 0.0–0.2)

## 2024-10-28 LAB — IRON AND TIBC
Iron: 70 ug/dL (ref 28–170)
Saturation Ratios: 19 % (ref 10.4–31.8)
TIBC: 374 ug/dL (ref 250–450)
UIBC: 304 ug/dL

## 2024-10-28 LAB — FERRITIN: Ferritin: 147 ng/mL (ref 11–307)

## 2025-01-26 ENCOUNTER — Inpatient Hospital Stay: Admitting: Oncology

## 2025-01-26 ENCOUNTER — Inpatient Hospital Stay
